# Patient Record
Sex: Female | Born: 1990
Health system: Southern US, Community
[De-identification: ages and names within clinical notes are randomized; demographics above are authoritative.]

## PROBLEM LIST (undated history)

## (undated) DIAGNOSIS — R011 Cardiac murmur, unspecified: Secondary | ICD-10-CM

## (undated) DIAGNOSIS — G43109 Migraine with aura, not intractable, without status migrainosus: Secondary | ICD-10-CM

## (undated) DIAGNOSIS — J309 Allergic rhinitis, unspecified: Secondary | ICD-10-CM

## (undated) DIAGNOSIS — N390 Urinary tract infection, site not specified: Secondary | ICD-10-CM

## (undated) DIAGNOSIS — R519 Headache, unspecified: Secondary | ICD-10-CM

## (undated) DIAGNOSIS — R9082 White matter disease, unspecified: Secondary | ICD-10-CM

## (undated) DIAGNOSIS — J45909 Unspecified asthma, uncomplicated: Secondary | ICD-10-CM

## (undated) DIAGNOSIS — E538 Deficiency of other specified B group vitamins: Secondary | ICD-10-CM

## (undated) DIAGNOSIS — R51 Headache: Secondary | ICD-10-CM

## (undated) DIAGNOSIS — G43909 Migraine, unspecified, not intractable, without status migrainosus: Secondary | ICD-10-CM

## (undated) DIAGNOSIS — F419 Anxiety disorder, unspecified: Secondary | ICD-10-CM

## (undated) HISTORY — DX: Migraine with aura, not intractable, without status migrainosus: G43.109

## (undated) HISTORY — DX: Urinary tract infection, site not specified: N39.0

## (undated) HISTORY — PX: NO PAST SURGERIES: SHX2092

## (undated) HISTORY — PX: OTHER SURGICAL HISTORY: SHX169

## (undated) HISTORY — DX: Headache: R51

## (undated) HISTORY — DX: Migraine, unspecified, not intractable, without status migrainosus: G43.909

## (undated) HISTORY — DX: Headache, unspecified: R51.9

## (undated) HISTORY — DX: Allergic rhinitis, unspecified: J30.9

## (undated) HISTORY — DX: White matter disease, unspecified: R90.82

## (undated) HISTORY — DX: Unspecified asthma, uncomplicated: J45.909

## (undated) HISTORY — DX: Cardiac murmur, unspecified: R01.1

## (undated) HISTORY — DX: Anxiety disorder, unspecified: F41.9

---
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
## 2015-09-27 ENCOUNTER — Ambulatory Visit (INDEPENDENT_AMBULATORY_CARE_PROVIDER_SITE_OTHER): Payer: BLUE CROSS/BLUE SHIELD | Admitting: Neurology

## 2015-09-27 ENCOUNTER — Other Ambulatory Visit: Payer: Self-pay | Admitting: Neurology

## 2015-09-27 ENCOUNTER — Telehealth: Payer: Self-pay | Admitting: Neurology

## 2015-09-27 ENCOUNTER — Encounter: Payer: Self-pay | Admitting: Neurology

## 2015-09-27 VITALS — BP 101/67 | HR 69 | Ht 59.0 in | Wt 107.2 lb

## 2015-09-27 DIAGNOSIS — R51 Headache: Secondary | ICD-10-CM

## 2015-09-27 DIAGNOSIS — R519 Headache, unspecified: Secondary | ICD-10-CM

## 2015-09-27 DIAGNOSIS — G43109 Migraine with aura, not intractable, without status migrainosus: Secondary | ICD-10-CM

## 2015-09-27 DIAGNOSIS — G35 Multiple sclerosis: Secondary | ICD-10-CM

## 2015-09-27 DIAGNOSIS — R42 Dizziness and giddiness: Secondary | ICD-10-CM | POA: Diagnosis not present

## 2015-09-27 DIAGNOSIS — H538 Other visual disturbances: Secondary | ICD-10-CM

## 2015-09-27 DIAGNOSIS — G379 Demyelinating disease of central nervous system, unspecified: Secondary | ICD-10-CM

## 2015-09-27 DIAGNOSIS — R9082 White matter disease, unspecified: Secondary | ICD-10-CM | POA: Insufficient documentation

## 2015-09-27 DIAGNOSIS — R930 Abnormal findings on diagnostic imaging of skull and head, not elsewhere classified: Secondary | ICD-10-CM

## 2015-09-27 DIAGNOSIS — G43809 Other migraine, not intractable, without status migrainosus: Secondary | ICD-10-CM

## 2015-09-27 MED ORDER — ALPRAZOLAM 0.5 MG PO TABS: ORAL_TABLET | ORAL | 0 refills | Status: DC

## 2015-09-27 NOTE — Telephone Encounter (Signed)
Pt states she needs valium to take prior to [facility]

## 2015-09-27 NOTE — Telephone Encounter (Signed)
Called and LVM for pt letting her know we will send rx xanax to her pharmacy. Advised her not to drive to and from test d/t it causing sedation and drowsiness. Gave [facility] phone number if she has further questions.   Fax: [phone]. Received confirmation.

## 2015-09-27 NOTE — Progress Notes (Addendum)
[facility]    Provider:  Dr Jaynee Eagles Referring Provider: Antoine Primas, MD Primary Care Physician:  Antoine Primas, MD  CC:  Vertigo and double vision  HPI:  Grace Calhoun is a 25 y.o. female here as a referral from Dr. Lacey Jensen for dizziness, ocular migraines. Past medical history of abnormal white matter changes on MRI, ocular migraines. She says she was diagnosed with possible MS or demyelinating disease and she has been under surveillance every 6 months. She has had 2 LPs. She was at work and she had an episode of left eye vision changes with diplopia and vertigo in 10/2013 which was the initial onset of symptoms without any inciting event or known trigger or head trauma . The double vision lasted less than a minute. She had residual light headedness. She had another episode a few weeks ago and she was sitting down and she felt like her head "slung forward" her eyesight "flung forward" but her head didn't move and then she experienced a severe sharp headache on both sides. Unknown triggers, no vertigo, just feeling spacey and fatigue. She is in grad school. She sees a neurologist in [place] ([facility] and she went to [facility]). Both episodes less than a minute. She has residual , dizziness, blurry vision. No other symptoms. She is under stress. She gets "hazy" a lot. She denies any current double vision,  numbness, tingling, weakness, no urination problems. No FHx of MS, aunt has an autoimmune disorder. No other associated symptoms or modifying factors. She continues to have dizziness and lightheadedness and blurry vision continuously. She has not had an ocular migraine for a while.   Reviewed notes, labs and imaging from outside physicians, which showed:   Reviewed notes her primary care. Patient recently moved to the area from [place]. In October 2015 she had an episode of vertigo and double vision. She had 2 lumbar  punctures performed. Working diagnosis was multiple sclerosis per patient results were inconclusive from lumbar punctures. She did have a scan and reported that she has a "lesion in my brain" that is being followed with serial MRIs every 6 months. Last MRI was April 2017. Patient stated this episode was different than her original one, she was sitting down and had a sensation that her head had been flung forward and then back again, vision suddenly went down than up again, sensation followed by a very brief head pain and then residual lightheadedness lasting 2-3 minutes. She has a history of ocular migraines with aura and hip pain. .   Review of Systems: Patient complains of symptoms per HPI as well as the following symptoms: No CP, no SOB, double vision, vertigo. Pertinent negatives per HPI. All others negative.   Social History   Social History  . Marital status: Married    Spouse name: Mikeal Hawthorne  . Number of children: 0  . Years of education: 68   Occupational History  . Grad student    Social History Main Topics  . Smoking status: Never Smoker  . Smokeless tobacco: Never Used  . Alcohol use Yes     Comment: Socially  . Drug use: No  . Sexual activity: Not on file   Other Topics Concern  . Not on file   Social History Narrative   Lives husband.   Caffeine use:  Drinks coffee daily    Family History  Problem Relation Age of Onset  . Heart disease Paternal Grandfather     Past Medical  History:  Diagnosis Date  . Ocular migraine   . White matter abnormality on MRI of brain     Past Surgical History:  Procedure Laterality Date  . NO PAST SURGERIES      Current Outpatient Prescriptions  Medication Sig Dispense Refill  . ibuprofen (ADVIL,MOTRIN) 200 MG tablet Take 200 mg by mouth every 6 (six) hours as needed.    Marland Kitchen VALERIAN PO Take 1 Dose by mouth 3 (three) times a week.     No current facility-administered medications for this visit.     Allergies as of  09/27/2015  . (Not on File)    Vitals: BP 101/67 (BP Location: Right Arm, Patient Position: Sitting, Cuff Size: Normal)   Pulse 69   Ht 4\' 11"  (1.499 m)   Wt 107 lb 3.2 oz (48.6 kg)   BMI 21.65 kg/m  Last Weight:  Wt Readings from Last 1 Encounters:  09/27/15 107 lb 3.2 oz (48.6 kg)   Last Height:   Ht Readings from Last 1 Encounters:  09/27/15 4\' 11"  (1.499 m)    Physical exam: Exam: Gen: NAD, conversant, well nourised, well groomed                     CV: RRR, no MRG. No Carotid Bruits. No peripheral edema, warm, nontender Eyes: Conjunctivae clear without exudates or hemorrhage  Neuro: Detailed Neurologic Exam  Speech:    Speech is normal; fluent and spontaneous with normal comprehension.  Cognition:    The patient is oriented to person, place, and time;     recent and remote memory intact;     language fluent;     normal attention, concentration,     fund of knowledge Cranial Nerves:    The pupils are equal, round, and reactive to light. The fundi are normal and spontaneous venous pulsations are present. Visual fields are full to finger confrontation. Extraocular movements are intact. Trigeminal sensation is intact and the muscles of mastication are normal. The face is symmetric. The palate elevates in the midline. Hearing intact. Voice is normal. Shoulder shrug is normal. The tongue has normal motion without fasciculations.   Coordination:    Normal finger to nose and heel to shin. Normal rapid alternating movements.   Gait:    Heel-toe and tandem gait are normal.   Motor Observation:    No asymmetry, no atrophy, and no involuntary movements noted. Tone:    Normal muscle tone.    Posture:    Posture is normal. normal erect    Strength:    Strength is V/V in the upper and lower limbs.      Sensation: intact to LT     Reflex Exam:  DTR's:    Deep tendon reflexes in the upper and lower extremities are normal bilaterally.   Toes:    The toes are  downgoing bilaterally.   Clonus:    Clonus is absent.      Assessment/Plan:  25 year old female with history of demyelinating plaques in the brain. Patient has been seeing a neurologist in [place] for surveillance every 6 months of the brain and cervical spine. Patient with new onset symptoms, residual dizziness, lightheadedness, cognitive changes of fogginess. She has had multiple lumbar punctures in the past. We'll request all the information.  - Will request records from previous neurologist and need CDs with imaging - MRI of the brain and cervical spine for evaluation of demyelinating plaques seen on MRI in the past with new onset  symptoms concerning for new demyelinating plaques or exacerbation - Labs today  Addendum 02/12/2016: MRI of the brain 10/2013 and 03/2015 showed a stable intra-axial mass within the right cerebellum measuring 1.6 x 2cm.  Cc: Antoine Primas, MD  Sarina Ill, MD  [facility] [address] [place], [place] [address]  Phone [phone] Fax [phone]

## 2015-09-27 NOTE — Patient Instructions (Addendum)
Remember to drink plenty of fluid, eat healthy meals and do not skip any meals. Try to eat protein with a every meal and eat a healthy snack such as fruit or nuts in between meals. Try to keep a regular sleep-wake schedule and try to exercise daily, particularly in the form of walking, 20-30 minutes a day, if you can.   As far as diagnostic testing: MRI of the brain, labs, will request records  Our phone number is [phone]. We also have an after hours call service for urgent matters and there is a physician on-call for urgent questions. For any emergencies you know to call 911 or go to the nearest emergency room

## 2015-09-27 NOTE — Telephone Encounter (Signed)
prinitng now thanks

## 2015-09-28 ENCOUNTER — Telehealth: Payer: Self-pay | Admitting: *Deleted

## 2015-09-28 LAB — CBC WITH DIFFERENTIAL/PLATELET
BASOS: 0 %
Basophils Absolute: 0 10*3/uL (ref 0.0–0.2)
EOS (ABSOLUTE): 0.1 10*3/uL (ref 0.0–0.4)
EOS: 1 %
HEMATOCRIT: 42.4 % (ref 34.0–46.6)
Hemoglobin: 14.4 g/dL (ref 11.1–15.9)
IMMATURE GRANS (ABS): 0 10*3/uL (ref 0.0–0.1)
IMMATURE GRANULOCYTES: 0 %
LYMPHS: 31 %
Lymphocytes Absolute: 2.2 10*3/uL (ref 0.7–3.1)
MCH: 30.3 pg (ref 26.6–33.0)
MCHC: 34 g/dL (ref 31.5–35.7)
MCV: 89 fL (ref 79–97)
Monocytes Absolute: 0.5 10*3/uL (ref 0.1–0.9)
Monocytes: 7 %
NEUTROS ABS: 4.2 10*3/uL (ref 1.4–7.0)
NEUTROS PCT: 61 %
Platelets: 302 10*3/uL (ref 150–379)
RBC: 4.75 x10E6/uL (ref 3.77–5.28)
RDW: 12.7 % (ref 12.3–15.4)
WBC: 7 10*3/uL (ref 3.4–10.8)

## 2015-09-28 LAB — COMPREHENSIVE METABOLIC PANEL
A/G RATIO: 1.8 (ref 1.2–2.2)
ALT: 8 IU/L (ref 0–32)
AST: 18 IU/L (ref 0–40)
Albumin: 4.6 g/dL (ref 3.5–5.5)
Alkaline Phosphatase: 88 IU/L (ref 39–117)
BUN/Creatinine Ratio: 12 (ref 9–23)
BUN: 8 mg/dL (ref 6–20)
Bilirubin Total: 0.6 mg/dL (ref 0.0–1.2)
CALCIUM: 9.6 mg/dL (ref 8.7–10.2)
CO2: 25 mmol/L (ref 18–29)
CREATININE: 0.65 mg/dL (ref 0.57–1.00)
Chloride: 101 mmol/L (ref 96–106)
GFR, EST AFRICAN AMERICAN: 145 mL/min/{1.73_m2} (ref >59)
GFR, EST NON AFRICAN AMERICAN: 126 mL/min/{1.73_m2} (ref >59)
GLOBULIN, TOTAL: 2.5 g/dL (ref 1.5–4.5)
Glucose: 68 mg/dL (ref 65–99)
POTASSIUM: 3.9 mmol/L (ref 3.5–5.2)
SODIUM: 140 mmol/L (ref 134–144)
TOTAL PROTEIN: 7.1 g/dL (ref 6.0–8.5)

## 2015-09-28 NOTE — Telephone Encounter (Signed)
-----   Message from Melvenia Beam, MD sent at 09/28/2015 10:42 AM EDT ----- Labs normal

## 2015-09-28 NOTE — Telephone Encounter (Signed)
I faxed a release to  [facility] requesting office notes, labs, and MRI CDs reports on 09/28/15. Fax [phone] # [phone] . I also faxed to [facility] requesting notes, lab test, mri results. Fax [phone] # [phone] on 09/2715.

## 2015-09-28 NOTE — Telephone Encounter (Signed)
Called and LVM for pt about normal labs per Dr Jaynee Eagles. Gave [facility] phone number if she has further questions.

## 2015-10-11 NOTE — Telephone Encounter (Signed)
Patient called requesting to change the time of her MRI appointment tomorrow. Reine Just, who will call patient back and requested patient's insurance info. Patient "doesn't know insurance info off the top of her head and is driving right now".  Patient is afraid Andee Poles won't get back w/her in time and she has back to back appointments today, once she gets to work and she doesn't want to get charged because she can't make that appointment time tomorrow, she needs the latest time, she wants arrival time of 4 or 4:30pm or push to next Wednesday. Patient advised per Andee Poles, latest appointment time tomorrow is 11:30. Per Andee Poles, patient will need to speak to Angie in billing about fee. Patient advised that her appointment time is 7:30am and she has called < 24 hrs from appointment time. Patient states she called at 7am and no one answered. I asked patient if she spoke w/answering service, patient states no one, including answering service picked up her call. Transferred patient to Angie/Billing and Insurance.

## 2015-10-12 ENCOUNTER — Other Ambulatory Visit: Payer: BLUE CROSS/BLUE SHIELD

## 2015-10-12 NOTE — Telephone Encounter (Signed)
Spoke with patient and cancelled apt.

## 2015-11-07 ENCOUNTER — Encounter: Payer: Self-pay | Admitting: Neurology

## 2016-01-04 ENCOUNTER — Ambulatory Visit: Payer: BLUE CROSS/BLUE SHIELD | Admitting: Neurology

## 2016-01-04 ENCOUNTER — Other Ambulatory Visit: Payer: Self-pay

## 2016-01-05 ENCOUNTER — Telehealth: Payer: Self-pay | Admitting: Neurology

## 2016-01-05 NOTE — Telephone Encounter (Signed)
Called and LVM for pt advising MRI brain and MRI cervical spine are two separate tests (looks at different areas of the body) and cannot be combined. Advised her to call back if she has any further questions or concerns.

## 2016-01-05 NOTE — Telephone Encounter (Signed)
Patient called in reference to MRI brain/C-spine, she is wanting to know if there is any way to do one MRI that is able to cover the brain and the C-spine? Please call

## 2016-01-05 NOTE — Telephone Encounter (Signed)
Called and spoke to patient. She stated she knows Dr Jaynee Eagles recommended MRI brain but is wondering if she recommends she have cervical also. At visit, she mentioned to Dr Jaynee Eagles that previous neurologist suggested doing cervical also. She can have both, but she is wondering if Dr Jaynee Eagles only ordered because she suggested this or if she should only have MRI brain. Advised I will send message to Dr Jaynee Eagles and call her back tomorrow to advise.

## 2016-01-05 NOTE — Telephone Encounter (Signed)
Grace Calhoun, both studies have already been ordered back in September and she is scheduled for two studies already. An MRI of the brain and MRI of the cervical spine were ordered at appointment, not sure why she didn't know that thanks

## 2016-01-05 NOTE — Telephone Encounter (Signed)
Patient calling back with a few more questions regarding MRI's. Please call

## 2016-01-06 NOTE — Telephone Encounter (Signed)
Called and spoke to pt. Advised per AA,MD, she would like her to have both tests to rule out possible lesions in both locations. Pt is agreeable to plan. She is having tests this upcoming Tuesday. Advised we will call her with results once those come back.

## 2016-01-10 ENCOUNTER — Ambulatory Visit
Admission: RE | Admit: 2016-01-10 | Discharge: 2016-01-10 | Disposition: A | Discharge status: Home / Self Care | Payer: BLUE CROSS/BLUE SHIELD | Source: Ambulatory Visit | Attending: Neurology | Admitting: Neurology

## 2016-01-10 DIAGNOSIS — R42 Dizziness and giddiness: Secondary | ICD-10-CM

## 2016-01-10 DIAGNOSIS — R51 Headache: Secondary | ICD-10-CM

## 2016-01-10 DIAGNOSIS — H538 Other visual disturbances: Secondary | ICD-10-CM

## 2016-01-10 DIAGNOSIS — R9082 White matter disease, unspecified: Secondary | ICD-10-CM

## 2016-01-10 DIAGNOSIS — M5020 Other cervical disc displacement, unspecified cervical region: Secondary | ICD-10-CM | POA: Diagnosis not present

## 2016-01-10 DIAGNOSIS — G35 Multiple sclerosis: Secondary | ICD-10-CM

## 2016-01-10 DIAGNOSIS — G379 Demyelinating disease of central nervous system, unspecified: Secondary | ICD-10-CM | POA: Diagnosis not present

## 2016-01-10 DIAGNOSIS — R519 Headache, unspecified: Secondary | ICD-10-CM

## 2016-01-10 DIAGNOSIS — G939 Disorder of brain, unspecified: Secondary | ICD-10-CM | POA: Diagnosis not present

## 2016-01-10 MED ORDER — GADOBENATE DIMEGLUMINE 529 MG/ML IV SOLN
9.0000 mL | Freq: Once | INTRAVENOUS | Status: AC | PRN
  Administered 2016-01-10: 9 mL via INTRAVENOUS

## 2016-01-17 ENCOUNTER — Telehealth: Payer: Self-pay | Admitting: *Deleted

## 2016-01-17 NOTE — Telephone Encounter (Signed)
LVM for pt to call about results. Gave [facility] phone number.  

## 2016-01-17 NOTE — Telephone Encounter (Signed)
-----   Message from Melvenia Beam, MD sent at 01/16/2016  5:54 PM EST ----- The MRI of her brain does have focus/lesion in the cerebellum. I believe this is the lesion that they are watching her for MS. However it does not appear that there are any new lesions. I still have not received the MRI images from her previous neurologist and I will look into this because I would like to compare this MRI with her old MRI.  MRI cervical spine also did not show any lesions. I believe her imaging appears stable.  thanks.

## 2016-01-17 NOTE — Telephone Encounter (Signed)
Called and spoke to pt about results per AA,MD note. She verbalized understanding. She is also going to call to see if she can get her records sent to get previous MRI CD. Advised I will have Grace Calhoun in medical records request this again.  Patient states she might have a copy of MRI CD. If so, she will drop it off for Dr Jaynee Eagles to review.

## 2016-01-20 ENCOUNTER — Telehealth: Payer: Self-pay | Admitting: *Deleted

## 2016-01-20 NOTE — Telephone Encounter (Addendum)
Pt medical records from [facility] on [place] desk.

## 2016-01-24 DIAGNOSIS — N390 Urinary tract infection, site not specified: Secondary | ICD-10-CM | POA: Diagnosis not present

## 2016-01-24 DIAGNOSIS — R350 Frequency of micturition: Secondary | ICD-10-CM | POA: Diagnosis not present

## 2016-01-30 DIAGNOSIS — R350 Frequency of micturition: Secondary | ICD-10-CM | POA: Diagnosis not present

## 2016-02-13 DIAGNOSIS — N3000 Acute cystitis without hematuria: Secondary | ICD-10-CM | POA: Diagnosis not present

## 2016-02-13 DIAGNOSIS — R350 Frequency of micturition: Secondary | ICD-10-CM | POA: Diagnosis not present

## 2016-02-13 NOTE — Telephone Encounter (Signed)
Patient called office in reference from [facility] to see if we received MRI results to compare MRI she has had done with [place].  Please call

## 2016-02-15 NOTE — Telephone Encounter (Signed)
I did and it is stable thanks

## 2016-02-22 DIAGNOSIS — F418 Other specified anxiety disorders: Secondary | ICD-10-CM | POA: Diagnosis not present

## 2016-03-07 DIAGNOSIS — F418 Other specified anxiety disorders: Secondary | ICD-10-CM | POA: Diagnosis not present

## 2016-03-09 DIAGNOSIS — Z3009 Encounter for other general counseling and advice on contraception: Secondary | ICD-10-CM | POA: Diagnosis not present

## 2016-03-09 DIAGNOSIS — Z01419 Encounter for gynecological examination (general) (routine) without abnormal findings: Secondary | ICD-10-CM | POA: Diagnosis not present

## 2016-03-09 DIAGNOSIS — Z6821 Body mass index (BMI) 21.0-21.9, adult: Secondary | ICD-10-CM | POA: Diagnosis not present

## 2016-05-10 ENCOUNTER — Telehealth: Payer: Self-pay | Admitting: Neurology

## 2016-05-10 NOTE — Telephone Encounter (Signed)
Multiple previous MRIs have been stable. I think we can hold off for one year unless she has new symptoms then she is to call us immediately thanks

## 2016-05-10 NOTE — Telephone Encounter (Signed)
Called pt to schedule 1 yr follow-up. Asks if she should have another MRI this summer. Stated that her previous neurologist recommended imaging every 6 months.

## 2016-05-10 NOTE — Telephone Encounter (Signed)
Pt was last seen in Sept 2017. MRI of her brain (01/2016) showed stable focus/lesion in the cerebellum.

## 2016-05-10 NOTE — Telephone Encounter (Signed)
Returned pt call and let her know that MRIs can now be done yearly but to call immediately w/ new or worsening symptoms. In the meantime, requested that she call to schedule 1 yr follow-up appt in Sept. May also call w/ any questions/concerns.

## 2016-05-10 NOTE — Telephone Encounter (Signed)
Patient called office in reference to see when she is to return for a follow up visit last seen 9/17.  Please call

## 2016-05-10 NOTE — Telephone Encounter (Signed)
One year follow up thanks

## 2016-05-31 DIAGNOSIS — Z30430 Encounter for insertion of intrauterine contraceptive device: Secondary | ICD-10-CM | POA: Diagnosis not present

## 2016-05-31 DIAGNOSIS — Z3202 Encounter for pregnancy test, result negative: Secondary | ICD-10-CM | POA: Diagnosis not present

## 2016-07-24 DIAGNOSIS — Z30431 Encounter for routine checking of intrauterine contraceptive device: Secondary | ICD-10-CM | POA: Diagnosis not present

## 2016-08-14 DIAGNOSIS — Z3202 Encounter for pregnancy test, result negative: Secondary | ICD-10-CM | POA: Diagnosis not present

## 2016-08-14 DIAGNOSIS — N926 Irregular menstruation, unspecified: Secondary | ICD-10-CM | POA: Diagnosis not present

## 2016-08-14 DIAGNOSIS — R102 Pelvic and perineal pain: Secondary | ICD-10-CM | POA: Diagnosis not present

## 2016-08-14 DIAGNOSIS — N911 Secondary amenorrhea: Secondary | ICD-10-CM | POA: Diagnosis not present

## 2016-10-01 DIAGNOSIS — R102 Pelvic and perineal pain: Secondary | ICD-10-CM | POA: Diagnosis not present

## 2016-10-18 ENCOUNTER — Ambulatory Visit (INDEPENDENT_AMBULATORY_CARE_PROVIDER_SITE_OTHER): Payer: BLUE CROSS/BLUE SHIELD | Admitting: Neurology

## 2016-10-18 ENCOUNTER — Encounter: Payer: Self-pay | Admitting: Neurology

## 2016-10-18 VITALS — BP 110/68 | HR 65 | Wt 111.2 lb

## 2016-10-18 DIAGNOSIS — R519 Headache, unspecified: Secondary | ICD-10-CM

## 2016-10-18 DIAGNOSIS — R42 Dizziness and giddiness: Secondary | ICD-10-CM | POA: Diagnosis not present

## 2016-10-18 DIAGNOSIS — C719 Malignant neoplasm of brain, unspecified: Secondary | ICD-10-CM | POA: Diagnosis not present

## 2016-10-18 DIAGNOSIS — C716 Malignant neoplasm of cerebellum: Secondary | ICD-10-CM | POA: Diagnosis not present

## 2016-10-18 DIAGNOSIS — R51 Headache with orthostatic component, not elsewhere classified: Secondary | ICD-10-CM

## 2016-10-18 NOTE — Patient Instructions (Signed)
Remember to drink plenty of fluid, eat healthy meals and do not skip any meals. Try to eat protein with a every meal and eat a healthy snack such as fruit or nuts in between meals. Try to keep a regular sleep-wake schedule and try to exercise daily, particularly in the form of walking, 20-30 minutes a day, if you can.   I would like to see you back in 9 months, sooner if we need to. Please call us with any interim questions, concerns, problems, updates or refill requests.   Our phone number is [phone]. We also have an after hours call service for urgent matters and there is a physician on-call for urgent questions. For any emergencies you know to call 911 or go to the nearest emergency room

## 2016-10-18 NOTE — Progress Notes (Signed)
[facility]    Provider:  Dr Jaynee Eagles  CC:  Low grade Glioma  Interval update: 26 year old female with history of an intra-axial 2*2 cm mass right cerebellum possibly low-grade glioma. Patient with vertigo and positional headaches. Need follow up MRI brain as well as referral to [facility] neurology for possible biopsy or surgical treatment if clinically warranted.     HPI:  Grace Calhoun is a 26 y.o. female here as a referral from Dr. Lacey Jensen for dizziness, ocular migraines. Past medical history of abnormal white matter changes on MRI, ocular migraines. She says she was diagnosed with possible MS or demyelinating disease and she has been under surveillance every 6 months. She has had 2 LPs. She was at work and she had an episode of left eye vision changes with diplopia and vertigo in 10/2013 which was the initial onset of symptoms without any inciting event or known trigger or head trauma . The double vision lasted less than a minute. She had residual light headedness. She had another episode a few weeks ago and she was sitting down and she felt like her head "slung forward" her eyesight "flung forward" but her head didn't move and then she experienced a severe sharp headache on both sides. Unknown triggers, no vertigo, just feeling spacey and fatigue. She is in grad school. She sees a neurologist in [place] ([facility] and she went to [facility]). Both episodes less than a minute. She has residual , dizziness, blurry vision. No other symptoms. She is under stress. She gets "hazy" a lot. She denies any current double vision,  numbness, tingling, weakness, no urination problems. No FHx of MS, aunt has an autoimmune disorder. No other associated symptoms or modifying factors. She continues to have dizziness and lightheadedness and blurry vision continuously. She has not had an ocular migraine for a while.   Reviewed notes, labs and imaging  from outside physicians, which showed:   Reviewed notes her primary care. Patient recently moved to the area from [place]. In October 2015 she had an episode of vertigo and double vision. She had 2 lumbar punctures performed. Working diagnosis was multiple sclerosis per patient results were inconclusive from lumbar punctures. She did have a scan and reported that she has a "lesion in my brain" that is being followed with serial MRIs every 6 months. Last MRI was April 2017. Patient stated this episode was different than her original one, she was sitting down and had a sensation that her head had been flung forward and then back again, vision suddenly went down than up again, sensation followed by a very brief head pain and then residual lightheadedness lasting 2-3 minutes. She has a history of ocular migraines with aura and hip pain. .   Review of Systems: Patient complains of symptoms per HPI as well as the following symptoms: No CP, no SOB, double vision, vertigo. Pertinent negatives per HPI. All others negative.  Social History   Social History  . Marital status: Married    Spouse name: Mikeal Hawthorne  . Number of children: 0  . Years of education: 10   Occupational History  . Grad student    Social History Main Topics  . Smoking status: Never Smoker  . Smokeless tobacco: Never Used  . Alcohol use Yes     Comment: Socially  . Drug use: No  . Sexual activity: Yes   Other Topics Concern  . Not on file   Social History Narrative   Lives  at home with husband.   Caffeine use:  Drinks coffee daily   Right handed       Family History  Problem Relation Age of Onset  . Heart disease Paternal Grandfather     Past Medical History:  Diagnosis Date  . Ocular migraine   . White matter abnormality on MRI of brain     Past Surgical History:  Procedure Laterality Date  . NO PAST SURGERIES      Current Outpatient Prescriptions  Medication Sig Dispense Refill  . ALPRAZolam (XANAX)  0.5 MG tablet Take 1-2 30-60 minutes before MRI. May take one additional if needed. Do not drive 3 tablet 0  . ibuprofen (ADVIL,MOTRIN) 200 MG tablet Take 200 mg by mouth every 6 (six) hours as needed.    Marland Kitchen VALERIAN PO Take 1 Dose by mouth 3 (three) times a week. Takes as needed     No current facility-administered medications for this visit.     Allergies as of 10/18/2016 - Review Complete 09/27/2015  Allergen Reaction Noted  . Naproxen  02/13/2016    Vitals: BP 110/68   Pulse 65   Wt 111 lb 3.2 oz (50.4 kg)   BMI 22.46 kg/m  Last Weight:  Wt Readings from Last 1 Encounters:  10/18/16 111 lb 3.2 oz (50.4 kg)   Last Height:   Ht Readings from Last 1 Encounters:  09/27/15 4\' 11"  (1.499 m)    Physical exam: Exam: Gen: NAD, conversant, well nourised, well groomed                     CV: RRR, no MRG. No Carotid Bruits. No peripheral edema, warm, nontender Eyes: Conjunctivae clear without exudates or hemorrhage  Neuro: Detailed Neurologic Exam  Speech:    Speech is normal; fluent and spontaneous with normal comprehension.  Cognition:    The patient is oriented to person, place, and time;     recent and remote memory intact;     language fluent;     normal attention, concentration,     fund of knowledge Cranial Nerves:    The pupils are equal, round, and reactive to light. The fundi are normal and spontaneous venous pulsations are present. Visual fields are full to finger confrontation. Extraocular movements are intact. Trigeminal sensation is intact and the muscles of mastication are normal. The face is symmetric. The palate elevates in the midline. Hearing intact. Voice is normal. Shoulder shrug is normal. The tongue has normal motion without fasciculations.   Coordination:    Normal finger to nose and heel to shin. Normal rapid alternating movements.   Gait:    Heel-toe and tandem gait are normal.   Motor Observation:    No asymmetry, no atrophy, and no involuntary  movements noted. Tone:    Normal muscle tone.    Posture:    Posture is normal. normal erect    Strength:    Strength is V/V in the upper and lower limbs.      Sensation: intact to LT     Reflex Exam:  DTR's:    Deep tendon reflexes in the upper and lower extremities are normal bilaterally.   Toes:    The toes are equiv bilaterally.   Clonus:    Clonus is absent.      Assessment/Plan:  26 year old female with history of an intra-axial 2*2 cm mass right cerebellum possibly low-grade glioma. Patient with vertigo and positional headaches.  -MRI of the brain to follow low-grade  glioma due to continued vertigo, nausea, positional headache - Need follow up MRI brain as well as referral to [facility] neurology(Dr. Tommi Rumps) for possible biopsy or surgical treatment if clinically warranted.   Addendum 02/12/2016: MRI of the brain 10/2013 and 03/2015 showed a stable intra-axial mass within the right cerebellum measuring 1.6 x 2cm. LPs not consistent with multiple sclerosis.    Cc: Antoine Primas, MD  Orders Placed This Encounter  Procedures  . MR BRAIN W WO CONTRAST  . Ambulatory referral to Neurosurgery    Sarina Ill, MD  [facility] [address] [place], [place] [address]  Phone [phone] Fax [phone]  A total of 30 minutes was spent face-to-face with this patient. Over half this time was spent on counseling patient on the glioma diagnosis and different diagnostic and therapeutic options available.

## 2016-10-20 ENCOUNTER — Encounter: Payer: Self-pay | Admitting: Neurology

## 2016-10-21 ENCOUNTER — Telehealth: Payer: Self-pay | Admitting: Neurology

## 2016-10-21 DIAGNOSIS — C716 Malignant neoplasm of cerebellum: Secondary | ICD-10-CM | POA: Insufficient documentation

## 2016-10-21 NOTE — Telephone Encounter (Signed)
Grace Calhoun, can we get patient an appointment with Dr. Tommi Rumps only? We can wait for it, the glioma is stable.It is in a perilous location, would prefer [place]. Let patient know Grace Calhoun has to bring a CD with her latest imaging on it please thanks

## 2016-10-22 ENCOUNTER — Telehealth: Payer: Self-pay | Admitting: Radiology

## 2016-10-22 NOTE — Telephone Encounter (Signed)
Pt returned Emily's call °

## 2016-10-23 NOTE — Telephone Encounter (Signed)
Order has been sent to Dr. Shanda Bumps office Telephone [phone] - fax [phone].  I have spoke to patient she aware that she has to take her MRI disc. Dr, Jaynee Eagles is aware. Patient is waiting until January for her apt.

## 2016-10-23 NOTE — Telephone Encounter (Signed)
Left a voicemail for patient to call back about scheduling mri.

## 2016-10-23 NOTE — Telephone Encounter (Signed)
She can wait until the first of the year can we schedule it out that far?

## 2016-10-23 NOTE — Telephone Encounter (Signed)
I called the patient to schedule her MRI.. I informed her that it would be about $718.31. She was wondering does she need to have it or now or can she wait until the first of the year? Please advise.

## 2016-10-23 NOTE — Telephone Encounter (Signed)
Noted, thank you I will call the patient and schedule her for the new year.

## 2016-10-23 NOTE — Telephone Encounter (Signed)
Spoke to the patient and informed her Per Dr. Jaynee Eagles it is okay to wait until the new year. She stated she will call me when it closer to the new year to schedule it. I stated that will be okay.

## 2016-11-13 ENCOUNTER — Telehealth: Payer: Self-pay | Admitting: Neurology

## 2016-11-13 ENCOUNTER — Encounter: Payer: Self-pay | Admitting: Internal Medicine

## 2016-11-13 ENCOUNTER — Ambulatory Visit: Payer: BLUE CROSS/BLUE SHIELD | Admitting: Internal Medicine

## 2016-11-13 VITALS — BP 112/72 | HR 70 | Temp 98.4°F | Ht 59.0 in | Wt 108.0 lb

## 2016-11-13 DIAGNOSIS — Z23 Encounter for immunization: Secondary | ICD-10-CM

## 2016-11-13 DIAGNOSIS — Z0000 Encounter for general adult medical examination without abnormal findings: Secondary | ICD-10-CM | POA: Diagnosis not present

## 2016-11-13 DIAGNOSIS — J309 Allergic rhinitis, unspecified: Secondary | ICD-10-CM | POA: Insufficient documentation

## 2016-11-13 DIAGNOSIS — F419 Anxiety disorder, unspecified: Secondary | ICD-10-CM | POA: Diagnosis not present

## 2016-11-13 HISTORY — DX: Allergic rhinitis, unspecified: J30.9

## 2016-11-13 HISTORY — DX: Anxiety disorder, unspecified: F41.9

## 2016-11-13 MED ORDER — DIAZEPAM 5 MG PO TABS: 5.0000 mg | ORAL_TABLET | Freq: Two times a day (BID) | ORAL | 0 refills | Status: DC | PRN

## 2016-11-13 NOTE — Patient Instructions (Addendum)
You had the flu shot today  Please take all new medication as prescribed - the valium as needed  Please continue all other medications as before, and refills have been done if requested.  Please have the pharmacy call with any other refills you may need.  Please continue your efforts at being more active, low cholesterol diet, and weight control.  You are otherwise up to date with prevention measures today.  Please keep your appointments with your specialists as you may have planned  Please go to the LAB in the Basement (turn left off the elevator) for the tests to be done today  You will be contacted by phone if any changes need to be made immediately.  Otherwise, you will receive a letter about your results with an explanation, but please check with MyChart first.  Please remember to sign up for MyChart if you have not done so, as this will be important to you in the future with finding out test results, communicating by private email, and scheduling acute appointments online when needed.  Please return in 1 year for your yearly visit, or sooner if needed

## 2016-11-13 NOTE — Assessment & Plan Note (Signed)

## 2016-11-13 NOTE — Assessment & Plan Note (Signed)
Mild situational, ok for small limited valium asd,  to f/u any worsening symptoms or concerns

## 2016-11-13 NOTE — Progress Notes (Signed)
Subjective:    Patient ID: Grace Calhoun, female    DOB: [birth year], 25 y.o.   MRN: [id number]  HPI  Here for wellness and f/u;  Overall doing ok;  Pt denies Chest pain, worsening SOB, DOE, wheezing, orthopnea, PND, worsening LE edema, palpitations, dizziness or syncope.  Pt denies neurological change such as new headache, facial or extremity weakness.  Pt denies polydipsia, polyuria, or low sugar symptoms. Pt states overall good compliance with treatment and medications, good tolerability, and has been trying to follow appropriate diet.  Pt denies worsening depressive symptoms, suicidal ideation or panic. No fever, night sweats, wt loss, loss of appetite, or other constitutional symptoms.  Pt states good ability with ADL's, has low fall risk, home safety reviewed and adequate, no other significant changes in hearing or vision, and very active with exercise, primarily running Working on masters for mental health counseling.  Has persistent anxiety d/o, no panic and Denies worsening depressive symptoms, suicidal ideation, or panic; is flying soon and is requesting tx Past Medical History:  Diagnosis Date  . Allergic rhinitis 11/13/2016  . Anxiety 11/13/2016  . Asthma   . Frequent headaches   . Heart murmur   . Migraines   . Ocular migraine   . UTI (urinary tract infection)   . White matter abnormality on MRI of brain    Past Surgical History:  Procedure Laterality Date  . lumbar puncture    . NO PAST SURGERIES      reports that  has never smoked. she has never used smokeless tobacco. She reports that she drinks alcohol. She reports that she does not use drugs. family history includes Heart disease in her paternal grandfather. Allergies  Allergen Reactions  . Naproxen     Other reaction(s): Dizziness   Current Outpatient Medications on File Prior to Visit  Medication Sig Dispense Refill  . ibuprofen (ADVIL,MOTRIN) 200 MG tablet Take 200 mg by mouth every 6 (six) hours as needed.    Marland Kitchen  VALERIAN PO Take 1 Dose by mouth 3 (three) times a week. Takes as needed     No current facility-administered medications on file prior to visit.    Review of Systems Constitutional: Negative for other unusual diaphoresis, sweats, appetite or weight changes HENT: Negative for other worsening hearing loss, ear pain, facial swelling, mouth sores or neck stiffness.   Eyes: Negative for other worsening pain, redness or other visual disturbance.  Respiratory: Negative for other stridor or swelling Cardiovascular: Negative for other palpitations or other chest pain  Gastrointestinal: Negative for worsening diarrhea or loose stools, blood in stool, distention or other pain Genitourinary: Negative for hematuria, flank pain or other change in urine volume.  Musculoskeletal: Negative for myalgias or other joint swelling.  Skin: Negative for other color change, or other wound or worsening drainage.  Neurological: Negative for other syncope or numbness. Hematological: Negative for other adenopathy or swelling Psychiatric/Behavioral: Negative for hallucinations, other worsening agitation, SI, self-injury, or new decreased concentration All other system neg per pt    Objective:   Physical Exam BP 112/72   Pulse 70   Temp 98.4 F (36.9 C) (Oral)   Ht 4\' 11"  (1.499 m)   Wt 108 lb (49 kg)   SpO2 99%   BMI 21.81 kg/m  VS noted,  Constitutional: Pt is oriented to person, place, and time. Appears well-developed and well-nourished, in no significant distress and comfortable Head: Normocephalic and atraumatic  Eyes: Conjunctivae and EOM are normal. Pupils are equal,  round, and reactive to light Right Ear: External ear normal without discharge Left Ear: External ear normal without discharge Nose: Nose without discharge or deformity Mouth/Throat: Oropharynx is without other ulcerations and moist  Neck: Normal range of motion. Neck supple. No JVD present. No tracheal deviation present or significant  neck LA or mass Cardiovascular: Normal rate, regular rhythm, normal heart sounds and intact distal pulses.   Pulmonary/Chest: WOB normal and breath sounds without rales or wheezing  Abdominal: Soft. Bowel sounds are normal. NT. No HSM  Musculoskeletal: Normal range of motion. Exhibits no edema Lymphadenopathy: Has no other cervical adenopathy.  Neurological: Pt is alert and oriented to person, place, and time. Pt has normal reflexes. No cranial nerve deficit. Motor grossly intact, Gait intact Skin: Skin is warm and dry. No rash noted or new ulcerations Psychiatric:  Has nervous mood and affect. Behavior is normal without agitation No other exam findings     Assessment & Plan:

## 2016-11-13 NOTE — Telephone Encounter (Signed)
Patient is scheduled to have her MRI done on 01/09/17 at the [facility] mobile unit. She did inform me that she is claustrophobic and will need something to help her.

## 2016-11-14 NOTE — Telephone Encounter (Signed)
Grace Calhoun, please place order for Xanax 0.25mg  take 1-2 tabs 30-60 minutes before MRI, may take one additional if needed, do not drive dispense 4, refill 0

## 2016-11-15 MED ORDER — ALPRAZOLAM 0.25 MG PO TABS: ORAL_TABLET | ORAL | 0 refills | Status: DC

## 2016-11-15 NOTE — Telephone Encounter (Signed)
Order written & printed for Xanax 0.25 mg tablets. Take 1-2 tabs 30-60 minutes before MRI. May take one additional if needed. Do not drive. Dispensed 4 tabs with 0 refills.

## 2016-12-19 NOTE — Telephone Encounter (Addendum)
Called and LVM asking patient if she still needs meds to help her relax for the MRI. Asked for call back to let us know and we can fax it to her pharmacy.

## 2016-12-28 NOTE — Telephone Encounter (Signed)
Called patient and LVM (ok per DPR) letting her know that Xanax prescription is being faxed to her pharmacy. Also included instructions: Take 1-2 tablets by mouth 30-60 minutes before MRI. May take one additional if needed. Do not drive. Asked for a call back to let us know she got this message. Xanax faxed to [facility] on [address]. Received a receipt of confirmation.

## 2017-01-09 ENCOUNTER — Ambulatory Visit: Payer: BLUE CROSS/BLUE SHIELD

## 2017-01-09 DIAGNOSIS — C719 Malignant neoplasm of brain, unspecified: Secondary | ICD-10-CM

## 2017-01-09 DIAGNOSIS — R42 Dizziness and giddiness: Secondary | ICD-10-CM

## 2017-01-09 DIAGNOSIS — R51 Headache with orthostatic component, not elsewhere classified: Secondary | ICD-10-CM

## 2017-01-09 DIAGNOSIS — R519 Headache, unspecified: Secondary | ICD-10-CM

## 2017-01-09 MED ORDER — GADOPENTETATE DIMEGLUMINE 469.01 MG/ML IV SOLN: 10.0000 mL | Freq: Once | INTRAVENOUS | Status: AC | PRN

## 2017-01-15 ENCOUNTER — Telehealth: Payer: Self-pay | Admitting: *Deleted

## 2017-01-15 NOTE — Telephone Encounter (Addendum)
Called patient and LVM informing her that her MRI brain is stable, no changes. Informed her that this message does not require a call back but encouraged her to call with any questions. Left office number if needed.   ----- Message from Melvenia Beam, MD sent at 01/11/2017  3:46 PM EST ----- MRI brain is stable. No changes. thanks

## 2017-03-13 ENCOUNTER — Encounter: Payer: Self-pay | Admitting: Family Medicine

## 2017-03-13 ENCOUNTER — Ambulatory Visit (INDEPENDENT_AMBULATORY_CARE_PROVIDER_SITE_OTHER): Payer: BLUE CROSS/BLUE SHIELD

## 2017-03-13 ENCOUNTER — Ambulatory Visit: Payer: BLUE CROSS/BLUE SHIELD | Admitting: Family Medicine

## 2017-03-13 VITALS — BP 116/64 | HR 84 | Temp 98.6°F | Ht 59.0 in | Wt 109.0 lb

## 2017-03-13 DIAGNOSIS — M79674 Pain in right toe(s): Secondary | ICD-10-CM

## 2017-03-13 MED ORDER — DICLOFENAC SODIUM 2 % TD SOLN: 1.0000 "application " | Freq: Two times a day (BID) | TRANSDERMAL | 3 refills | Status: DC

## 2017-03-13 NOTE — Assessment & Plan Note (Signed)
Pain is likely related to a sprain of the capsule of the first MTP joint. Does not appear to be any fracture. - Continue the postop shoe for at least 2 more weeks. Can try transitioning her as she allows. - Counseled on another over-the-counter devices - If no improvement follow-up we'll consider imaging and maybe pursue an MRI. Could consider an injection to help with pain.

## 2017-03-13 NOTE — Patient Instructions (Addendum)
Please try the brace and see how that does.  I have sent in pennsaid which you can rub over the area.  Ice may help with any pain.   Please follow up with me in 3-4 weeks if this hasn't improved.

## 2017-03-13 NOTE — Progress Notes (Signed)
Grace Calhoun - 27 y.o. female MRN [id number]  Date of birth: [birth year]  SUBJECTIVE:  Including CC & ROS.  Chief Complaint  Patient presents with  . Sprained toe    Grace Calhoun is a 27 y.o. female that is presenting with right toe pain. She was rock climbing six weeks ago and pushed off the wall. She noticed a sharp pain located at her MTP joint. She is unable to run or rock climb due to the pain. She saw a sports physican at [facility], stated she had turf toe. Pain is worse standing and being active. She has been wearing a post op shoe for the past four weeks. She has been taking Motrin for the pain. Denies swelling or tenderness. She had improvement with her symptoms but were exacerbated with dropping something on her foot about a week ago. She also has exacerbation of her pain with having to walk across campus.   Review of Systems  Constitutional: Negative for fever.  HENT: Negative for congestion.   Eyes: Negative for visual disturbance.  Respiratory: Negative for cough.   Cardiovascular: Negative for chest pain.  Gastrointestinal: Negative for abdominal pain.  Musculoskeletal: Positive for gait problem.  Skin: Negative for color change.  Neurological: Negative for weakness.  Hematological: Negative for adenopathy.  Psychiatric/Behavioral: Negative for agitation.    HISTORY: Past Medical, Surgical, Social, and Family History Reviewed & Updated per EMR.   Pertinent Historical Findings include:  Past Medical History:  Diagnosis Date  . Allergic rhinitis 11/13/2016  . Anxiety 11/13/2016  . Asthma   . Frequent headaches   . Heart murmur   . Migraines   . Ocular migraine   . UTI (urinary tract infection)   . White matter abnormality on MRI of brain     Past Surgical History:  Procedure Laterality Date  . lumbar puncture    . NO PAST SURGERIES      Allergies  Allergen Reactions  . Naproxen     Other reaction(s): Dizziness    Family History  Problem Relation Age of  Onset  . Heart disease Paternal Grandfather      Social History   Socioeconomic History  . Marital status: Married    Spouse name: Mikeal Hawthorne  . Number of children: 0  . Years of education: 40  . Highest education level: Not on file  Social Needs  . Financial resource strain: Not on file  . Food insecurity - worry: Not on file  . Food insecurity - inability: Not on file  . Transportation needs - medical: Not on file  . Transportation needs - non-medical: Not on file  Occupational History  . Occupation: Insurance underwriter  Tobacco Use  . Smoking status: Never Smoker  . Smokeless tobacco: Never Used  Substance and Sexual Activity  . Alcohol use: Yes    Comment: Socially  . Drug use: No  . Sexual activity: Yes  Other Topics Concern  . Not on file  Social History Narrative   Lives at home with husband.   Caffeine use:  Drinks coffee daily   Right handed     PHYSICAL EXAM:  VS: BP 116/64 (BP Location: Left Arm, Patient Position: Sitting, Cuff Size: Normal)   Pulse 84   Temp 98.6 F (37 C) (Oral)   Ht 4\' 11"  (1.499 m)   Wt 109 lb (49.4 kg)   SpO2 98%   BMI 22.02 kg/m  Physical Exam Gen: NAD, alert, cooperative with exam, well-appearing ENT: normal lips, normal nasal  mucosa,  Eye: normal EOM, normal conjunctiva and lids CV:  no edema, +2 pedal pulses   Resp: no accessory muscle use, non-labored,  Skin: no rashes, no areas of induration  Neuro: normal tone, normal sensation to touch Psych:  normal insight, alert and oriented MSK:  Right foot:  Hallux valgus b/l  No redness or swelling  Pain with flexion and extension of the first MTP  Normal longitudinal arch  Neurovascularly intact.   Limited ultrasound: right great toe:  Minimal effusion within the joint  Excessive motion of the capsule of the first MCP joint. No obvious fracture  Summary: Capsular sprain  Ultrasound and interpretation by Clearance Coots, MD      ASSESSMENT & PLAN:   No problem-specific  Assessment & Plan notes found for this encounter.

## 2017-05-08 DIAGNOSIS — Z01419 Encounter for gynecological examination (general) (routine) without abnormal findings: Secondary | ICD-10-CM | POA: Diagnosis not present

## 2017-05-08 DIAGNOSIS — Z6821 Body mass index (BMI) 21.0-21.9, adult: Secondary | ICD-10-CM | POA: Diagnosis not present

## 2017-05-31 ENCOUNTER — Ambulatory Visit: Payer: BLUE CROSS/BLUE SHIELD | Admitting: Family Medicine

## 2017-05-31 DIAGNOSIS — F418 Other specified anxiety disorders: Secondary | ICD-10-CM | POA: Diagnosis not present

## 2017-06-11 ENCOUNTER — Ambulatory Visit (INDEPENDENT_AMBULATORY_CARE_PROVIDER_SITE_OTHER): Payer: BLUE CROSS/BLUE SHIELD

## 2017-06-11 ENCOUNTER — Encounter: Payer: Self-pay | Admitting: Family Medicine

## 2017-06-11 ENCOUNTER — Ambulatory Visit: Payer: BLUE CROSS/BLUE SHIELD | Admitting: Family Medicine

## 2017-06-11 VITALS — BP 116/68 | HR 76 | Ht 59.0 in | Wt 108.0 lb

## 2017-06-11 DIAGNOSIS — M79674 Pain in right toe(s): Secondary | ICD-10-CM

## 2017-06-11 DIAGNOSIS — M2011 Hallux valgus (acquired), right foot: Secondary | ICD-10-CM | POA: Diagnosis not present

## 2017-06-11 MED ORDER — DICLOFENAC SODIUM 2 % TD SOLN: 1.0000 "application " | Freq: Two times a day (BID) | TRANSDERMAL | 3 refills | Status: AC

## 2017-06-11 NOTE — Progress Notes (Signed)
Grace Calhoun - 27 y.o. female MRN [id number]  Date of birth: 30-Sep-1990  SUBJECTIVE:  Including CC & ROS.  Chief Complaint  Patient presents with  . Follow-up    Grace Calhoun is a 27 y.o. female that is here today for right great toe pain follow up. She was seen 03/13/17 she reports no improvement in her pain. She has been trying to increase her activity-rock climbing and running. Pain is exacerbated with activity. Pain is localized at her medial 1st MTP joint. She has not been taking anything for the pain. She noticed some swelling after she walks or stands for long periods. No pain with walking. Pain is    Review of Systems  Constitutional: Negative for fever.  HENT: Negative for congestion.   Respiratory: Negative for cough.   Cardiovascular: Negative for chest pain.  Gastrointestinal: Negative for abdominal pain.  Musculoskeletal: Negative for gait problem.  Skin: Negative for color change.  Neurological: Negative for weakness.  Hematological: Negative for adenopathy.  Psychiatric/Behavioral: Negative for agitation.    HISTORY: Past Medical, Surgical, Social, and Family History Reviewed & Updated per EMR.   Pertinent Historical Findings include:  Past Medical History:  Diagnosis Date  . Allergic rhinitis 11/13/2016  . Anxiety 11/13/2016  . Asthma   . Frequent headaches   . Heart murmur   . Migraines   . Ocular migraine   . UTI (urinary tract infection)   . White matter abnormality on MRI of brain     Past Surgical History:  Procedure Laterality Date  . lumbar puncture    . NO PAST SURGERIES      Allergies  Allergen Reactions  . Naproxen     Other reaction(s): Dizziness    Family History  Problem Relation Age of Onset  . Heart disease Paternal Grandfather      Social History   Socioeconomic History  . Marital status: Married    Spouse name: Mikeal Hawthorne  . Number of children: 0  . Years of education: 72  . Highest education level: Not on file    Occupational History  . Occupation: Insurance underwriter  Social Needs  . Financial resource strain: Not on file  . Food insecurity:    Worry: Not on file    Inability: Not on file  . Transportation needs:    Medical: Not on file    Non-medical: Not on file  Tobacco Use  . Smoking status: Never Smoker  . Smokeless tobacco: Never Used  Substance and Sexual Activity  . Alcohol use: Yes    Comment: Socially  . Drug use: No  . Sexual activity: Yes  Lifestyle  . Physical activity:    Days per week: Not on file    Minutes per session: Not on file  . Stress: Not on file  Relationships  . Social connections:    Talks on phone: Not on file    Gets together: Not on file    Attends religious service: Not on file    Active member of club or organization: Not on file    Attends meetings of clubs or organizations: Not on file    Relationship status: Not on file  . Intimate partner violence:    Fear of current or ex partner: Not on file    Emotionally abused: Not on file    Physically abused: Not on file    Forced sexual activity: Not on file  Other Topics Concern  . Not on file  Social History Narrative  Lives at home with husband.   Caffeine use:  Drinks coffee daily   Right handed     PHYSICAL EXAM:  VS: BP 116/68 (BP Location: Left Arm, Patient Position: Sitting, Cuff Size: Normal)   Pulse 76   Ht 4\' 11"  (1.499 m)   Wt 108 lb (49 kg)   SpO2 99%   BMI 21.81 kg/m  Physical Exam Gen: NAD, alert, cooperative with exam, well-appearing ENT: normal lips, normal nasal mucosa,  Eye: normal EOM, normal conjunctiva and lids CV:  no edema, +2 pedal pulses   Resp: no accessory muscle use, non-labored,  Skin: no rashes, no areas of induration  Neuro: normal tone, normal sensation to touch Psych:  normal insight, alert and oriented MSK:  Right foot:  Hallux valgus  Normal great toe extension  Limited great toe flexion  TTP of the medial great toe  No overlying redness or redness.   Neurovascularly intact.    Aspiration/Injection Procedure Note Grace Calhoun 06-11-1990  Procedure: Injection Indications: right great toe   Procedure Details Consent: Risks of procedure as well as the alternatives and risks of each were explained to the (patient/caregiver).  Consent for procedure obtained. Time Out: Verified patient identification, verified procedure, site/side was marked, verified correct patient position, special equipment/implants available, medications/allergies/relevent history reviewed, required imaging and test results available.  Performed.  The area was cleaned with iodine and alcohol swabs.    The right 1st MTP joint was injected using 1 cc's of 40 mg Depomedrol and 1 cc's of 1% lidocaine with a 25 1 1/2" needle.  Ultrasound was used. Images were obtained in  Long views showing the injection.    A sterile dressing was applied.  Patient did tolerate procedure well.    ASSESSMENT & PLAN:   Great toe pain, right Has been ongoing for roughly 5 months. She may have exacerbated this by rock climbing. Limited padding on the plantar forefoot under the 1st MTP joint  - injection today  - counseled on padding of the bunion  - if no improvement consider smartec orthotics vs MRI

## 2017-06-11 NOTE — Assessment & Plan Note (Addendum)
Has been ongoing for roughly 5 months. She may have exacerbated this by rock climbing. Limited padding on the plantar forefoot under the 1st MTP joint  - injection today  - pennsaid  - counseled on padding of the bunion  - if no improvement consider smartec orthotics vs MRI

## 2017-06-11 NOTE — Patient Instructions (Addendum)
Nice to see you  Please try a cushion over this area.  Please try the medication over the area Please follow up to have orthotics made

## 2017-06-12 DIAGNOSIS — F418 Other specified anxiety disorders: Secondary | ICD-10-CM | POA: Diagnosis not present

## 2017-07-09 ENCOUNTER — Telehealth: Payer: Self-pay | Admitting: Internal Medicine

## 2017-07-09 NOTE — Telephone Encounter (Signed)
Copied from [place] [phone]. Topic: Quick Communication - Rx Refill/Question >> Jul 09, 2017 12:19 PM Oliver Pila B wrote: Medication: Diclofenac Sodium (PENNSAID) 2 % SOLN [[id number]]   [facility] called about the medication above to get a diagnosis code and to find out what else the pt has taking, contact [phone]

## 2017-07-10 NOTE — Telephone Encounter (Signed)
Contacted pharmacy, DX code provided

## 2017-08-19 DIAGNOSIS — F40218 Other animal type phobia: Secondary | ICD-10-CM | POA: Diagnosis not present

## 2017-09-09 DIAGNOSIS — F40218 Other animal type phobia: Secondary | ICD-10-CM | POA: Diagnosis not present

## 2017-09-23 DIAGNOSIS — F40218 Other animal type phobia: Secondary | ICD-10-CM | POA: Diagnosis not present

## 2017-10-02 ENCOUNTER — Other Ambulatory Visit: Payer: Self-pay | Admitting: Neurology

## 2017-10-02 ENCOUNTER — Telehealth: Payer: Self-pay | Admitting: Neurology

## 2017-10-02 DIAGNOSIS — G939 Disorder of brain, unspecified: Secondary | ICD-10-CM

## 2017-10-02 DIAGNOSIS — G379 Demyelinating disease of central nervous system, unspecified: Secondary | ICD-10-CM

## 2017-10-02 NOTE — Telephone Encounter (Signed)
I have the patient MRI scheduled for 12/18/17 patient requested that day. I also made a follow up appointment to see Dr. Jaynee Eagles on 01/08/18 patient also requested that day.

## 2017-10-02 NOTE — Telephone Encounter (Signed)
Sure, I have ordered it if we can get it approved by insurance she can schedule it for nov or dec thanks

## 2017-10-02 NOTE — Telephone Encounter (Signed)
Pt requesting a call, stating she is wanting her yearly MRI scheduled for November or December of this year if possible. Please advise. Pt aware she will also need to schedule a f/u

## 2017-10-02 NOTE — Telephone Encounter (Addendum)
Noted thank you

## 2017-10-07 DIAGNOSIS — F40218 Other animal type phobia: Secondary | ICD-10-CM | POA: Diagnosis not present

## 2017-10-10 ENCOUNTER — Encounter: Payer: Self-pay | Admitting: Internal Medicine

## 2017-10-10 MED ORDER — DIAZEPAM 5 MG PO TABS: 5.0000 mg | ORAL_TABLET | Freq: Two times a day (BID) | ORAL | 0 refills | Status: DC | PRN

## 2017-10-10 NOTE — Telephone Encounter (Signed)
Done erx 

## 2017-10-21 DIAGNOSIS — F40218 Other animal type phobia: Secondary | ICD-10-CM | POA: Diagnosis not present

## 2017-10-22 ENCOUNTER — Ambulatory Visit: Payer: BLUE CROSS/BLUE SHIELD

## 2017-12-09 ENCOUNTER — Other Ambulatory Visit: Payer: Self-pay | Admitting: Neurology

## 2017-12-09 MED ORDER — ALPRAZOLAM 0.25 MG PO TABS: ORAL_TABLET | ORAL | 0 refills | Status: DC

## 2017-12-09 NOTE — Telephone Encounter (Signed)
Noted, thank you

## 2017-12-09 NOTE — Telephone Encounter (Signed)
Called in to pharmacy thanks

## 2017-12-09 NOTE — Telephone Encounter (Signed)
Patient MRI is scheduled for 12/18/17. She also informed me she is claustrophobic and would need something to help her.

## 2017-12-18 ENCOUNTER — Ambulatory Visit: Payer: BLUE CROSS/BLUE SHIELD

## 2017-12-18 DIAGNOSIS — G379 Demyelinating disease of central nervous system, unspecified: Secondary | ICD-10-CM

## 2017-12-18 DIAGNOSIS — G939 Disorder of brain, unspecified: Secondary | ICD-10-CM | POA: Diagnosis not present

## 2017-12-18 MED ORDER — GADOBENATE DIMEGLUMINE 529 MG/ML IV SOLN
10.0000 mL | Freq: Once | INTRAVENOUS | Status: AC | PRN
  Administered 2017-12-18: 10 mL via INTRAVENOUS

## 2018-01-08 ENCOUNTER — Ambulatory Visit: Payer: BLUE CROSS/BLUE SHIELD | Admitting: Neurology

## 2018-01-21 ENCOUNTER — Ambulatory Visit: Payer: BLUE CROSS/BLUE SHIELD | Admitting: Neurology

## 2018-01-29 DIAGNOSIS — N760 Acute vaginitis: Secondary | ICD-10-CM | POA: Diagnosis not present

## 2018-01-29 DIAGNOSIS — N390 Urinary tract infection, site not specified: Secondary | ICD-10-CM | POA: Diagnosis not present

## 2018-03-17 ENCOUNTER — Other Ambulatory Visit: Payer: Self-pay | Admitting: Radiology

## 2018-03-17 DIAGNOSIS — N6320 Unspecified lump in the left breast, unspecified quadrant: Secondary | ICD-10-CM | POA: Diagnosis not present

## 2018-03-19 ENCOUNTER — Other Ambulatory Visit: Payer: Self-pay

## 2018-03-19 ENCOUNTER — Ambulatory Visit
Admission: RE | Admit: 2018-03-19 | Discharge: 2018-03-19 | Disposition: A | Discharge status: Home / Self Care | Payer: BLUE CROSS/BLUE SHIELD | Source: Ambulatory Visit | Attending: Radiology | Admitting: Radiology

## 2018-03-19 DIAGNOSIS — N6489 Other specified disorders of breast: Secondary | ICD-10-CM | POA: Diagnosis not present

## 2018-03-19 DIAGNOSIS — N6320 Unspecified lump in the left breast, unspecified quadrant: Secondary | ICD-10-CM

## 2018-03-20 ENCOUNTER — Other Ambulatory Visit: Payer: BLUE CROSS/BLUE SHIELD

## 2018-05-12 ENCOUNTER — Encounter: Payer: Self-pay | Admitting: Family Medicine

## 2018-05-12 ENCOUNTER — Ambulatory Visit: Payer: BLUE CROSS/BLUE SHIELD | Admitting: Family Medicine

## 2018-05-12 VITALS — BP 110/80 | HR 80 | Temp 98.8°F | Ht 59.0 in

## 2018-05-12 DIAGNOSIS — M357 Hypermobility syndrome: Secondary | ICD-10-CM | POA: Insufficient documentation

## 2018-05-12 DIAGNOSIS — R200 Anesthesia of skin: Secondary | ICD-10-CM

## 2018-05-12 NOTE — Assessment & Plan Note (Signed)
Beighton score 7/9.

## 2018-05-12 NOTE — Patient Instructions (Signed)
Good to see you  Please try either running or your workout video for a week and see if your symptoms  Please try a standing desk or changing posture at work  Please send me a message through [place] with updates or questions.  Please see me back in one month if no better.

## 2018-05-12 NOTE — Progress Notes (Signed)
Grace Calhoun - 28 y.o. female MRN [id number]  Date of birth: [birth year]  SUBJECTIVE:  Including CC & ROS.  Chief Complaint  Patient presents with  . Numbness    pins and needles in both legs/ on and off for couple weeks    Grace Calhoun is a 28 y.o. female that is presenting with bilateral posterior leg altered sensation.  This is been ongoing over the past few weeks.  She notices it more when she is running.  She has taken off the past few days from running and her symptoms have improved.  She denies any pain associated with it.  She denies any inciting event or trauma.  She feels it in the posterior calf and thighs.  She has altered her gait since having the great toe pain.  Has been having an MRI of her brain on a yearly basis following a possible demyelinating disease.  This is been ongoing for some time with no change to suspect any other demyelinating process.  Denies any other symptoms other than the ones described today.   Review of Systems  Constitutional: Negative for fever.  HENT: Negative for congestion.   Respiratory: Negative for cough.   Cardiovascular: Negative for chest pain.  Gastrointestinal: Negative for abdominal pain.  Musculoskeletal: Negative for back pain.  Skin: Negative for color change.  Neurological: Negative for weakness.  Hematological: Negative for adenopathy.  Psychiatric/Behavioral: Negative for agitation.    HISTORY: Past Medical, Surgical, Social, and Family History Reviewed & Updated per EMR.   Pertinent Historical Findings include:  Past Medical History:  Diagnosis Date  . Allergic rhinitis 11/13/2016  . Anxiety 11/13/2016  . Asthma   . Frequent headaches   . Heart murmur   . Migraines   . Ocular migraine   . UTI (urinary tract infection)   . White matter abnormality on MRI of brain     Past Surgical History:  Procedure Laterality Date  . lumbar puncture    . NO PAST SURGERIES      Allergies  Allergen Reactions  . Naproxen    Other reaction(s): Dizziness    Family History  Problem Relation Age of Onset  . Heart disease Paternal Grandfather   . Breast cancer Maternal Aunt      Social History   Socioeconomic History  . Marital status: Married    Spouse name: Mikeal Hawthorne  . Number of children: 0  . Years of education: 94  . Highest education level: Not on file  Occupational History  . Occupation: Insurance underwriter  Social Needs  . Financial resource strain: Not on file  . Food insecurity:    Worry: Not on file    Inability: Not on file  . Transportation needs:    Medical: Not on file    Non-medical: Not on file  Tobacco Use  . Smoking status: Never Smoker  . Smokeless tobacco: Never Used  Substance and Sexual Activity  . Alcohol use: Yes    Comment: Socially  . Drug use: No  . Sexual activity: Yes  Lifestyle  . Physical activity:    Days per week: Not on file    Minutes per session: Not on file  . Stress: Not on file  Relationships  . Social connections:    Talks on phone: Not on file    Gets together: Not on file    Attends religious service: Not on file    Active member of club or organization: Not on file    Attends  meetings of clubs or organizations: Not on file    Relationship status: Not on file  . Intimate partner violence:    Fear of current or ex partner: Not on file    Emotionally abused: Not on file    Physically abused: Not on file    Forced sexual activity: Not on file  Other Topics Concern  . Not on file  Social History Narrative   Lives at home with husband.   Caffeine use:  Drinks coffee daily   Right handed     PHYSICAL EXAM:  VS: BP 110/80   Pulse 80   Temp 98.8 F (37.1 C) (Oral)   Ht 4\' 11"  (1.499 m)   SpO2 98%   BMI 21.81 kg/m  Physical Exam Gen: NAD, alert, cooperative with exam, well-appearing ENT: normal lips, normal nasal mucosa,  Eye: normal EOM, normal conjunctiva and lids CV:  no edema, +2 pedal pulses   Resp: no accessory muscle use, non-labored,   Skin: no rashes, no areas of induration  Neuro: normal tone, normal sensation to touch Psych:  normal insight, alert and oriented MSK:  Left and Right leg:  No signs of atrophy  Normal IR and ER of the hips  Normal strength to resistance with hip flexion, knee flexion and extension, plantarfleixon and dorsalflexion  Normal gait  Neurovascularly intact   Beighton Score  Finger to forearm b/l  Pinky finger past 90 degress b/l  Mild hyperextension of b/l elbow  Little to no hyperextension of the knees  Can place hands flat on floor.       ASSESSMENT & PLAN:   Hypermobility syndrome Beighton score 7/9.   Leg numbness Possible for pain related to foot strike as she lands on the lateral forefoot and not much of a toe off the great toe. Related to her great toe injury from a few months ago. Possible for low ferritin. Has hypermobility with possible contribution. Not likely related to the white matter change observed on MRI.  - counseled on working out and monitoring of symptoms  - could consider checking ferritin, TSH, etc if continues  - can f/u in one month, could consider EMG.

## 2018-05-12 NOTE — Assessment & Plan Note (Signed)
Possible for pain related to foot strike as she lands on the lateral forefoot and not much of a toe off the great toe. Related to her great toe injury from a few months ago. Possible for low ferritin. Has hypermobility with possible contribution. Not likely related to the white matter change observed on MRI.  - counseled on working out and monitoring of symptoms  - could consider checking ferritin, TSH, etc if continues  - can f/u in one month, could consider EMG.

## 2018-06-17 ENCOUNTER — Encounter: Payer: Self-pay | Admitting: Family Medicine

## 2018-06-19 ENCOUNTER — Encounter: Payer: Self-pay | Admitting: *Deleted

## 2018-06-19 ENCOUNTER — Telehealth: Payer: Self-pay | Admitting: Neurology

## 2018-06-19 NOTE — Telephone Encounter (Signed)
Sent pt a Therapist, music. She has been seen by Dr. Raeford Razor for this. I suggested she continue to follow up with him and if he feels she needs neurology she can be referred for it.

## 2018-06-19 NOTE — Telephone Encounter (Signed)
Note pt read the mychart message today.

## 2018-06-19 NOTE — Telephone Encounter (Signed)
Pt states for several weeks she has been experiencing  Tingling and numbness in both legs.  Pt states sometimes it is worse with exercise.  Pt is asking for a call to discuss these symptoms and if she needs to come in.

## 2018-06-24 ENCOUNTER — Other Ambulatory Visit: Payer: Self-pay | Admitting: Family Medicine

## 2018-06-24 DIAGNOSIS — R200 Anesthesia of skin: Secondary | ICD-10-CM

## 2018-07-07 ENCOUNTER — Other Ambulatory Visit: Payer: Self-pay

## 2018-07-07 ENCOUNTER — Telehealth: Payer: Self-pay | Admitting: Neurology

## 2018-07-07 ENCOUNTER — Encounter: Payer: Self-pay | Admitting: Neurology

## 2018-07-07 ENCOUNTER — Ambulatory Visit: Payer: BLUE CROSS/BLUE SHIELD | Admitting: Neurology

## 2018-07-07 ENCOUNTER — Other Ambulatory Visit: Payer: Self-pay | Admitting: Neurology

## 2018-07-07 VITALS — BP 118/76 | HR 80 | Temp 98.4°F | Ht 59.0 in | Wt 113.0 lb

## 2018-07-07 DIAGNOSIS — R200 Anesthesia of skin: Secondary | ICD-10-CM | POA: Diagnosis not present

## 2018-07-07 DIAGNOSIS — R350 Frequency of micturition: Secondary | ICD-10-CM | POA: Diagnosis not present

## 2018-07-07 DIAGNOSIS — R29818 Other symptoms and signs involving the nervous system: Secondary | ICD-10-CM

## 2018-07-07 DIAGNOSIS — R799 Abnormal finding of blood chemistry, unspecified: Secondary | ICD-10-CM | POA: Diagnosis not present

## 2018-07-07 DIAGNOSIS — E531 Pyridoxine deficiency: Secondary | ICD-10-CM | POA: Diagnosis not present

## 2018-07-07 DIAGNOSIS — E519 Thiamine deficiency, unspecified: Secondary | ICD-10-CM | POA: Diagnosis not present

## 2018-07-07 DIAGNOSIS — R29898 Other symptoms and signs involving the musculoskeletal system: Secondary | ICD-10-CM

## 2018-07-07 DIAGNOSIS — R202 Paresthesia of skin: Secondary | ICD-10-CM

## 2018-07-07 DIAGNOSIS — H5462 Unqualified visual loss, left eye, normal vision right eye: Secondary | ICD-10-CM | POA: Diagnosis not present

## 2018-07-07 DIAGNOSIS — G35 Multiple sclerosis: Secondary | ICD-10-CM

## 2018-07-07 DIAGNOSIS — R258 Other abnormal involuntary movements: Secondary | ICD-10-CM

## 2018-07-07 DIAGNOSIS — E539 Vitamin B deficiency, unspecified: Secondary | ICD-10-CM | POA: Diagnosis not present

## 2018-07-07 MED ORDER — ALPRAZOLAM 0.25 MG PO TABS: ORAL_TABLET | ORAL | 0 refills | Status: AC

## 2018-07-07 NOTE — Telephone Encounter (Signed)
I spoke to the patient she is aware.  °

## 2018-07-07 NOTE — Progress Notes (Addendum)
[facility]    Provider:  Dr Jaynee Eagles  CC:  Low grade Glioma vs demyelination of cerebellum  Interval update July 08, 2018.  This is a 28 year old female with a history of intra-axial 2 x 2 centimeter mass in the right cerebellum unknown etiology, possible low-grade glioma versus demyelination has been postulated.  Today she returns with new symptoms. We referred her to [facility] and she was not contacted. She is a Licensed conveyancer. Symptoms are worse with stress so she understands may be anxiety however needs to be evaluated. She has had episodes of numbness and tingling in the legs and feet. It started slowly and progressively worsened and then improved. Worse with walking and running. Rest and in the morning would be better. No back pain. Right leg >> left. Both arms right >> left tingling.  Mouth went numb. Also tingling of the face unilaterally. Frequent urination, also temporary vision loss of the left eye.  Interval update: 28 year old female with history of an intra-axial 2*2 cm mass right cerebellum possibly low-grade glioma. Patient with vertigo and positional headaches. Need follow up MRI brain as well as referral to [facility] neurology for possible biopsy or surgical treatment if clinically warranted.     HPI:  Monisha Siebel is a 28 y.o. female here as a referral from Dr. Lacey Jensen for dizziness, ocular migraines. Past medical history of abnormal white matter changes on MRI, ocular migraines. She says she was diagnosed with possible MS or demyelinating disease and she has been under surveillance every 6 months. She has had 2 LPs. She was at work and she had an episode of left eye vision changes with diplopia and vertigo in 10/2013 which was the initial onset of symptoms without any inciting event or known trigger or head trauma . The double vision lasted less than a minute. She had residual light headedness. She had another episode a few weeks ago and she was sitting down and  she felt like her head "slung forward" her eyesight "flung forward" but her head didn't move and then she experienced a severe sharp headache on both sides. Unknown triggers, no vertigo, just feeling spacey and fatigue. She is in grad school. She sees a neurologist in [place] ([facility] and she went to [facility]). Both episodes less than a minute. She has residual , dizziness, blurry vision. No other symptoms. She is under stress. She gets "hazy" a lot. She denies any current double vision,  numbness, tingling, weakness, no urination problems. No FHx of MS, aunt has an autoimmune disorder. No other associated symptoms or modifying factors. She continues to have dizziness and lightheadedness and blurry vision continuously. She has not had an ocular migraine for a while.   Reviewed notes, labs and imaging from outside physicians, which showed:   Reviewed notes her primary care. Patient recently moved to the area from [place]. In October 2015 she had an episode of vertigo and double vision. She had 2 lumbar punctures performed. Working diagnosis was multiple sclerosis per patient results were inconclusive from lumbar punctures. She did have a scan and reported that she has a "lesion in my brain" that is being followed with serial MRIs every 6 months. Last MRI was April 2017. Patient stated this episode was different than her original one, she was sitting down and had a sensation that her head had been flung forward and then back again, vision suddenly went down than up again, sensation followed by a very brief head pain  and then residual lightheadedness lasting 2-3 minutes. She has a history of ocular migraines with aura and hip pain. .   Review of Systems: Patient complains of symptoms per HPI as well as the following symptoms: No CP, no SOB, double vision, vertigo. Pertinent negatives per HPI. All others negative.  Social History   Socioeconomic  History  . Marital status: Married    Spouse name: Mikeal Hawthorne  . Number of children: 0  . Years of education: 11  . Highest education level: Not on file  Occupational History  . Occupation: Insurance underwriter  Social Needs  . Financial resource strain: Not on file  . Food insecurity    Worry: Not on file    Inability: Not on file  . Transportation needs    Medical: Not on file    Non-medical: Not on file  Tobacco Use  . Smoking status: Never Smoker  . Smokeless tobacco: Never Used  Substance and Sexual Activity  . Alcohol use: Yes    Comment: Socially  . Drug use: No  . Sexual activity: Yes    Birth control/protection: I.U.D.  Lifestyle  . Physical activity    Days per week: Not on file    Minutes per session: Not on file  . Stress: Not on file  Relationships  . Social Herbalist on phone: Not on file    Gets together: Not on file    Attends religious service: Not on file    Active member of club or organization: Not on file    Attends meetings of clubs or organizations: Not on file    Relationship status: Not on file  . Intimate partner violence    Fear of current or ex partner: Not on file    Emotionally abused: Not on file    Physically abused: Not on file    Forced sexual activity: Not on file  Other Topics Concern  . Not on file  Social History Narrative   Lives at home with husband.   Caffeine use:  Drinks coffee daily   Right handed    Family History  Problem Relation Age of Onset  . Heart disease Paternal Grandfather   . Breast cancer Maternal Aunt     Past Medical History:  Diagnosis Date  . Allergic rhinitis 11/13/2016  . Anxiety 11/13/2016  . Asthma   . Frequent headaches   . Heart murmur   . Migraines   . Ocular migraine   . UTI (urinary tract infection)   . White matter abnormality on MRI of brain     Past Surgical History:  Procedure Laterality Date  . lumbar puncture    . NO PAST SURGERIES      Current Outpatient Medications   Medication Sig Dispense Refill  . acetaminophen (TYLENOL) 500 MG tablet Take 500-1,000 mg by mouth as needed.    Marland Kitchen ibuprofen (ADVIL,MOTRIN) 200 MG tablet Take 200 mg by mouth every 6 (six) hours as needed.    Marland Kitchen VALERIAN PO Take 1 Dose by mouth 3 (three) times a week. Takes as needed    . Diclofenac Sodium (PENNSAID) 2 % SOLN Place 1 application onto the skin 2 (two) times daily. (Patient not taking: Reported on 05/12/2018) 1 Bottle 3   No current facility-administered medications for this visit.    Facility-Administered Medications Ordered in Other Visits  Medication Dose Route Frequency Provider Last Rate Last Dose  . gadopentetate dimeglumine (MAGNEVIST) injection 10 mL  10 mL Intravenous Once  PRN Melvenia Beam, MD        Allergies as of 07/07/2018 - Review Complete 07/07/2018  Allergen Reaction Noted  . Naproxen  02/13/2016    Vitals: BP 118/76 (BP Location: Right Arm, Patient Position: Sitting)   Pulse 80   Temp 98.4 F (36.9 C) Comment: taken by front staff  Ht 4\' 11"  (1.499 m)   Wt 113 lb (51.3 kg)   BMI 22.82 kg/m  Last Weight:  Wt Readings from Last 1 Encounters:  07/07/18 113 lb (51.3 kg)   Last Height:   Ht Readings from Last 1 Encounters:  07/07/18 4\' 11"  (1.499 m)    Physical exam: Exam: Gen: NAD, conversant, well nourised, well groomed                     CV: RRR, no MRG. No Carotid Bruits. No peripheral edema, warm, nontender Eyes: Conjunctivae clear without exudates or hemorrhage  Neuro: Detailed Neurologic Exam  Speech:    Speech is normal; fluent and spontaneous with normal comprehension.  Cognition:    The patient is oriented to person, place, and time;     recent and remote memory intact;     language fluent;     normal attention, concentration,     fund of knowledge Cranial Nerves:    The pupils are equal, round, and reactive to light. The fundi are normal and spontaneous venous pulsations are present. Visual fields are full to finger  confrontation. Extraocular movements are intact. Trigeminal sensation is intact and the muscles of mastication are normal. The face is symmetric. The palate elevates in the midline. Hearing intact. Voice is normal. Shoulder shrug is normal. The tongue has normal motion without fasciculations.   Coordination:    Normal finger to nose and heel to shin. Normal rapid alternating movements.   Gait:    Heel-toe and tandem gait are normal.   Motor Observation:    No asymmetry, no atrophy, and no involuntary movements noted. Tone:    Normal muscle tone.    Posture:    Posture is normal. normal erect    Strength: Left hip flexion 4+/5, otherwise strength is V/V in the upper and lower limbs.      Sensation: intact to LT     Reflex Exam:  DTR's:    Deep tendon reflexes in the upper and lower extremities are normal bilaterally.   Toes:    The toes are equiv bilaterally.   Clonus:    Clonus patellars.      Assessment/Plan:  28 year old female with history of an intra-axial 2*2 cm mass right cerebellum possibly low-grade glioma vs demyelination/MS. Brisk reflexes with clonus. Needs evaluation for demyelination such as MS given her new neurologic symptoms which are concerning such as paresthesias in limbs, tingling in the face, vision loss left eye, focal weakness on exam.    Addendum 02/12/2016: MRI of the brain 10/2013 and 03/2015 showed a stable intra-axial mass within the right cerebellum measuring 1.6 x 2cm. Given new symptoms needs a thorough exam with f/u of MRI brain and cervical spine for MS.   Consider emg/ncs of left arm and right leg     Cc: Antoine Primas, MD  Orders Placed This Encounter  Procedures  . MR BRAIN W WO CONTRAST  . MR CERVICAL SPINE W WO CONTRAST  . CBC with Differential/Platelets  . Comprehensive metabolic panel  . TSH  . B12 and Folate Panel  . Methylmalonic acid, serum  . Vitamin  B1  . Sjogren's syndrome antibods(ssa + ssb)  . Pan-ANCA  .  Rheumatoid factor  . Heavy metals, blood  . Vitamin B6  . ANA  . ANA, IFA (with reflex)  . BClaris Che Antibody    Sarina Ill, MD  [facility] [address] [place], [place] [address]  Phone [phone] Fax [phone] A total of 40 minutes was spent face-to-face with this patient. Over half this time was spent on counseling patient on the  1. Paresthesia of upper and lower extremity   2. Numbness around mouth   3. Tingling of face   4. Frequent urination   5. Vision loss of left eye   6. Clonus   7. Left leg weakness   8. Transient neurological symptoms   9. Multiple sclerosis ([place])    diagnosis and different diagnostic and therapeutic options, counseling and coordination of care, risks ans benefits of management, compliance, or risk factor reduction and education.

## 2018-07-07 NOTE — Telephone Encounter (Signed)
Xanax sent to [facility] on [address] thanks!

## 2018-07-07 NOTE — Telephone Encounter (Signed)
no to the covid-19 questions MR Brain w/wo contrast & MR Cervical spine w/wo contrast Dr. Ihor Dow Auth: [place] via bcbs website. Patient is scheduled at [facility] for 07/09/18.  Patient also informed me she is claustrophobic and she will need something to help her. She is aware to have a driver.

## 2018-07-07 NOTE — Addendum Note (Signed)
Addended by: Sarina Ill B on: 07/07/2018 09:20 AM   Modules accepted: Orders

## 2018-07-07 NOTE — Patient Instructions (Signed)
MRI brain and cervical spine Blood work Consider EMG/NCS if needed   Electromyoneurogram Electromyoneurogram is a test to check how well your muscles and nerves are working. This procedure includes the combined use of electromyogram (EMG) and nerve conduction study (NCS). EMG is used to look for muscular disorders. NCS, which is also called electroneurogram, measures how well your nerves are controlling your muscles. The procedures are usually done together to check if your muscles and nerves are healthy. If the results of the tests are abnormal, this may indicate disease or injury, such as a neuromuscular disease or peripheral nerve damage. Tell a health care provider about:  Any allergies you have.  All medicines you are taking, including vitamins, herbs, eye drops, creams, and over-the-counter medicines.  Any problems you or family members have had with anesthetic medicines.  Any blood disorders you have.  Any surgeries you have had.  Any medical conditions you have.  If you have a pacemaker.  Whether you are pregnant or may be pregnant. What are the risks? Generally, this is a safe procedure. However, problems may occur, including:  Infection where the electrodes were inserted.  Bleeding. What happens before the procedure? Medicines Ask your health care provider about:  Changing or stopping your regular medicines. This is especially important if you are taking diabetes medicines or blood thinners.  Taking medicines such as aspirin and ibuprofen. These medicines can thin your blood. Do not take these medicines unless your health care provider tells you to take them.  Taking over-the-counter medicines, vitamins, herbs, and supplements. General instructions  Your health care provider may ask you to avoid: ? Beverages that have caffeine, such as coffee and tea. ? Any products that contain nicotine or tobacco. These products include cigarettes, e-cigarettes, and chewing  tobacco. If you need help quitting, ask your health care provider.  Do not use lotions or creams on the same day that you will be having the procedure. What happens during the procedure? For EMG   Your health care provider will ask you to stay in a position so that he or she can access the muscle that will be studied. You may be standing, sitting, or lying down.  You may be given a medicine that numbs the area (local anesthetic).  A very thin needle that has an electrode will be inserted into your muscle.  Another small electrode will be placed on your skin near the muscle.  Your health care provider will ask you to continue to remain still.  The electrodes will send a signal that tells about the electrical activity of your muscles. You may see this on a monitor or hear it in the room.  After your muscles have been studied at rest, your health care provider will ask you to contract or flex your muscles. The electrodes will send a signal that tells about the electrical activity of your muscles.  Your health care provider will remove the electrodes and the electrode needles when the procedure is finished. The procedure may vary among health care providers and hospitals. For NCS   An electrode that records your nerve activity (recording electrode) will be placed on your skin by the muscle that is being studied.  An electrode that is used as a reference (reference electrode) will be placed near the recording electrode.  A paste or gel will be applied to your skin between the recording electrode and the reference electrode.  Your nerve will be stimulated with a mild shock. Your health care  provider will measure how much time it takes for your muscle to react.  Your health care provider will remove the electrodes and the gel when the procedure is finished. The procedure may vary among health care providers and hospitals. What happens after the procedure?  It is up to you to get the  results of your procedure. Ask your health care provider, or the department that is doing the procedure, when your results will be ready.  Your health care provider may: ? Give you medicines for any pain. ? Monitor the insertion sites to make sure that bleeding stops. Summary  Electromyoneurogram is a test to check how well your muscles and nerves are working.  If the results of the tests are abnormal, this may indicate disease or injury.  This is a safe procedure. However, problems may occur, such as bleeding and infection.  Your health care provider will do two tests to complete this procedure. One checks your muscles (EMG) and another checks your nerves (NCS).  It is up to you to get the results of your procedure. Ask your health care provider, or the department that is doing the procedure, when your results will be ready. This information is not intended to replace advice given to you by your health care provider. Make sure you discuss any questions you have with your health care provider. Document Released: 04/20/2004 Document Revised: 09/03/2017 Document Reviewed: 08/16/2017 Elsevier Patient Education  2020 [employer].

## 2018-07-08 ENCOUNTER — Other Ambulatory Visit (INDEPENDENT_AMBULATORY_CARE_PROVIDER_SITE_OTHER): Payer: Self-pay

## 2018-07-08 ENCOUNTER — Other Ambulatory Visit: Payer: Self-pay | Admitting: Neurology

## 2018-07-08 ENCOUNTER — Ambulatory Visit (INDEPENDENT_AMBULATORY_CARE_PROVIDER_SITE_OTHER): Payer: BC Managed Care – PPO | Admitting: *Deleted

## 2018-07-08 ENCOUNTER — Telehealth: Payer: Self-pay | Admitting: Neurology

## 2018-07-08 ENCOUNTER — Other Ambulatory Visit: Payer: Self-pay

## 2018-07-08 DIAGNOSIS — E538 Deficiency of other specified B group vitamins: Secondary | ICD-10-CM

## 2018-07-08 DIAGNOSIS — Z0289 Encounter for other administrative examinations: Secondary | ICD-10-CM

## 2018-07-08 LAB — B. BURGDORFI ANTIBODIES: Lyme IgG/IgM Ab: <0.91 {ISR} (ref 0.00–0.90)

## 2018-07-08 MED ORDER — CYANOCOBALAMIN 1000 MCG/ML IJ SOLN
1000.0000 ug | Freq: Once | INTRAMUSCULAR | Status: AC
  Administered 2018-07-08: 17:00:00 1000 ug via INTRAMUSCULAR

## 2018-07-08 NOTE — Progress Notes (Signed)
Patient given B12 1000 mcg IM injection x 1 per v.o. Dr. Jaynee Eagles (after labs were drawn). Pt tolerated injection well. Bandaid applied. See MAR. Questions answered.

## 2018-07-08 NOTE — Telephone Encounter (Signed)
Patient with symptomatic  b12 deficiency. Called patient. Recommend 1030mcg b12 shots weekly for 4 weeks and then once a month for 6 months then PO and monitor.   B12 deficiency can cause weakness, fatigue, easy bruising or bleeding,sore tongue, stomach upset, weight loss, and diarrhea or constipation, tingling or numbness to the fingers and toes, difficulty walking, mood changes, depression, memory loss, disorientation and, in severe cases, dementia.

## 2018-07-09 ENCOUNTER — Other Ambulatory Visit: Payer: Self-pay

## 2018-07-09 ENCOUNTER — Ambulatory Visit: Payer: BC Managed Care – PPO

## 2018-07-09 DIAGNOSIS — R202 Paresthesia of skin: Secondary | ICD-10-CM

## 2018-07-09 DIAGNOSIS — R200 Anesthesia of skin: Secondary | ICD-10-CM | POA: Diagnosis not present

## 2018-07-09 DIAGNOSIS — G35 Multiple sclerosis: Secondary | ICD-10-CM | POA: Diagnosis not present

## 2018-07-09 LAB — SPECIMEN STATUS

## 2018-07-09 MED ORDER — GADOBENATE DIMEGLUMINE 529 MG/ML IV SOLN
10.0000 mL | Freq: Once | INTRAVENOUS | Status: AC | PRN
  Administered 2018-07-09: 10 mL via INTRAVENOUS

## 2018-07-11 LAB — CBC WITH DIFFERENTIAL/PLATELET
Basophils Absolute: 0 10*3/uL (ref 0.0–0.2)
Basos: 0 %
EOS (ABSOLUTE): 0.1 10*3/uL (ref 0.0–0.4)
Eos: 2 %
Hematocrit: 42.8 % (ref 34.0–46.6)
Hemoglobin: 14.6 g/dL (ref 11.1–15.9)
Immature Grans (Abs): 0 10*3/uL (ref 0.0–0.1)
Immature Granulocytes: 0 %
Lymphocytes Absolute: 1.7 10*3/uL (ref 0.7–3.1)
Lymphs: 24 %
MCH: 31.5 pg (ref 26.6–33.0)
MCHC: 34.1 g/dL (ref 31.5–35.7)
MCV: 92 fL (ref 79–97)
Monocytes Absolute: 0.6 10*3/uL (ref 0.1–0.9)
Monocytes: 8 %
Neutrophils Absolute: 4.8 10*3/uL (ref 1.4–7.0)
Neutrophils: 66 %
Platelets: 245 10*3/uL (ref 150–450)
RBC: 4.64 x10E6/uL (ref 3.77–5.28)
RDW: 11.8 % (ref 11.7–15.4)
WBC: 7.2 10*3/uL (ref 3.4–10.8)

## 2018-07-11 LAB — B12 AND FOLATE PANEL
Folate: >20 ng/mL (ref >3.0)
Vitamin B-12: 210 pg/mL — ABNORMAL LOW (ref 232–1245)

## 2018-07-11 LAB — PAN-ANCA
ANCA Proteinase 3: <3.5 U/mL (ref 0.0–3.5)
Atypical pANCA: <1:20 {titer}
C-ANCA: <1:20 {titer}
Myeloperoxidase Ab: <9 U/mL (ref 0.0–9.0)
P-ANCA: <1:20 {titer}

## 2018-07-11 LAB — COMPREHENSIVE METABOLIC PANEL
ALT: 8 IU/L (ref 0–32)
AST: 14 IU/L (ref 0–40)
Albumin/Globulin Ratio: 2.2 (ref 1.2–2.2)
Albumin: 4.8 g/dL (ref 3.9–5.0)
Alkaline Phosphatase: 75 IU/L (ref 39–117)
BUN/Creatinine Ratio: 12 (ref 9–23)
BUN: 9 mg/dL (ref 6–20)
Bilirubin Total: 0.5 mg/dL (ref 0.0–1.2)
CO2: 23 mmol/L (ref 20–29)
Calcium: 9.9 mg/dL (ref 8.7–10.2)
Chloride: 101 mmol/L (ref 96–106)
Creatinine, Ser: 0.74 mg/dL (ref 0.57–1.00)
GFR calc Af Amer: 129 mL/min/{1.73_m2} (ref >59)
GFR calc non Af Amer: 112 mL/min/{1.73_m2} (ref >59)
Globulin, Total: 2.2 g/dL (ref 1.5–4.5)
Glucose: 73 mg/dL (ref 65–99)
Potassium: 4.3 mmol/L (ref 3.5–5.2)
Sodium: 138 mmol/L (ref 134–144)
Total Protein: 7 g/dL (ref 6.0–8.5)

## 2018-07-11 LAB — SJOGREN'S SYNDROME ANTIBODS(SSA + SSB)
ENA SSA (RO) Ab: <0.2 AI (ref 0.0–0.9)
ENA SSB (LA) Ab: <0.2 AI (ref 0.0–0.9)

## 2018-07-11 LAB — ANA: ANA Titer 1: NEGATIVE

## 2018-07-11 LAB — VITAMIN B6: Vitamin B6: 44.2 ug/L — ABNORMAL HIGH (ref 2.0–32.8)

## 2018-07-11 LAB — TSH: TSH: 3.85 u[IU]/mL (ref 0.450–4.500)

## 2018-07-11 LAB — METHYLMALONIC ACID, SERUM: Methylmalonic Acid: 271 nmol/L (ref 0–378)

## 2018-07-11 LAB — HEAVY METALS, BLOOD
Arsenic: 6 ug/L (ref 2–23)
Lead, Blood: NOT DETECTED ug/dL (ref 0–4)
Mercury: 2.1 ug/L (ref 0.0–14.9)

## 2018-07-11 LAB — RHEUMATOID FACTOR: Rheumatoid fact SerPl-aCnc: <10 IU/mL (ref 0.0–13.9)

## 2018-07-11 LAB — VITAMIN B1: Thiamine: 143.4 nmol/L (ref 66.5–200.0)

## 2018-07-13 LAB — ANTI-PARIETAL ANTIBODY: Parietal Cell Ab: 2.9 Units (ref 0.0–20.0)

## 2018-07-13 LAB — INTRINSIC FACTOR ANTIBODIES: Intrinsic Factor Abs, Serum: 1.1 AU/mL (ref 0.0–1.1)

## 2018-07-13 LAB — METHYLMALONIC ACID, SERUM: Methylmalonic Acid: 441 nmol/L — ABNORMAL HIGH (ref 0–378)

## 2018-07-13 LAB — B12 AND FOLATE PANEL
Folate: >20 ng/mL (ref >3.0)
Vitamin B-12: 226 pg/mL — ABNORMAL LOW (ref 232–1245)

## 2018-07-15 ENCOUNTER — Other Ambulatory Visit: Payer: Self-pay

## 2018-07-15 ENCOUNTER — Ambulatory Visit (INDEPENDENT_AMBULATORY_CARE_PROVIDER_SITE_OTHER): Payer: BC Managed Care – PPO | Admitting: Internal Medicine

## 2018-07-15 ENCOUNTER — Other Ambulatory Visit: Payer: BC Managed Care – PPO

## 2018-07-15 ENCOUNTER — Encounter: Payer: Self-pay | Admitting: Internal Medicine

## 2018-07-15 DIAGNOSIS — E538 Deficiency of other specified B group vitamins: Secondary | ICD-10-CM

## 2018-07-15 HISTORY — DX: Deficiency of other specified B group vitamins: E53.8

## 2018-07-15 MED ORDER — CYANOCOBALAMIN 1000 MCG/ML IJ SOLN
1000.0000 ug | Freq: Once | INTRAMUSCULAR | Status: AC
  Administered 2018-07-15: 1000 ug via INTRAMUSCULAR

## 2018-07-15 NOTE — Assessment & Plan Note (Addendum)
Unusual finding of deficiency unlikely to diet related I think; for intrinsic factor ab, but if neg would need b12 weekly x 4 wks, then monthly for 6 mo, but if positive/abnormal would simply need monthly b12 indefinitely for presumed perniscious type b12 deficiency

## 2018-07-15 NOTE — Progress Notes (Signed)
Subjective:    Patient ID: Grace Calhoun, female    DOB: [birth year], 28 y.o.   MRN: [id number]  HPI  Here to f/u after recent neurology evaluation where MS was ruled out, but did find symptomatic B12 deficiency, needs intensive tx.  Pt denies chest pain, increased sob or doe, wheezing, orthopnea, PND, increased LE swelling, palpitations, dizziness or syncope.   Pt denies polydipsia, polyuria Past Medical History:  Diagnosis Date   Allergic rhinitis 11/13/2016   Anxiety 11/13/2016   Asthma    B12 deficiency 07/15/2018   Frequent headaches    Heart murmur    Migraines    Ocular migraine    UTI (urinary tract infection)    White matter abnormality on MRI of brain    Past Surgical History:  Procedure Laterality Date   lumbar puncture     NO PAST SURGERIES      reports that she has never smoked. She has never used smokeless tobacco. She reports current alcohol use. She reports that she does not use drugs. family history includes Breast cancer in her maternal aunt; Heart disease in her paternal grandfather. Allergies  Allergen Reactions   Naproxen     Other reaction(s): Dizziness   Current Outpatient Medications on File Prior to Visit  Medication Sig Dispense Refill   acetaminophen (TYLENOL) 500 MG tablet Take 500-1,000 mg by mouth as needed.     ALPRAZolam (XANAX) 0.25 MG tablet Take 1-2 tabs (0.25mg -0.50mg ) 30-60 minutes before procedure. May repeat if needed.Do not drive. 4 tablet 0   Diclofenac Sodium (PENNSAID) 2 % SOLN Place 1 application onto the skin 2 (two) times daily. 1 Bottle 3   ibuprofen (ADVIL,MOTRIN) 200 MG tablet Take 200 mg by mouth every 6 (six) hours as needed.     VALERIAN PO Take 1 Dose by mouth 3 (three) times a week. Takes as needed     Current Facility-Administered Medications on File Prior to Visit  Medication Dose Route Frequency Provider Last Rate Last Dose   gadopentetate dimeglumine (MAGNEVIST) injection 10 mL  10 mL Intravenous Once  PRN Melvenia Beam, MD       Review of Systems  Constitutional: Negative for other unusual diaphoresis or sweats HENT: Negative for ear discharge or swelling Eyes: Negative for other worsening visual disturbances Respiratory: Negative for stridor or other swelling  Gastrointestinal: Negative for worsening distension or other blood Genitourinary: Negative for retention or other urinary change Musculoskeletal: Negative for other MSK pain or swelling Skin: Negative for color change or other new lesions Neurological: Negative for worsening tremors and other numbness  Psychiatric/Behavioral: Negative for worsening agitation or other fatigue All other system neg pe rpt    Objective:   Physical Exam Pulse 72    Temp 98.8 F (37.1 C) (Oral)    Ht 4\' 11"  (1.499 m)    Wt 111 lb (50.3 kg)    SpO2 99%    BMI 22.42 kg/m  VS noted,  Constitutional: Pt appears in NAD HENT: Head: NCAT.  Right Ear: External ear normal.  Left Ear: External ear normal.  Eyes: . Pupils are equal, round, and reactive to light. Conjunctivae and EOM are normal Nose: without d/c or deformity Neck: Neck supple. Gross normal ROM Cardiovascular: Normal rate and regular rhythm.   Pulmonary/Chest: Effort normal and breath sounds without rales or wheezing.  Neurological: Pt is alert. At baseline orientation, motor grossly intact Skin: Skin is warm. No rashes, other new lesions, no LE edema Psychiatric: Pt behavior  is normal without agitation  No other exam findings Lab Results  Component Value Date   WBC WILL FOLLOW 07/08/2018   HGB WILL FOLLOW 07/08/2018   HCT WILL FOLLOW 07/08/2018   PLT WILL FOLLOW 07/08/2018   GLUCOSE 73 07/07/2018   ALT 8 07/07/2018   AST 14 07/07/2018   NA 138 07/07/2018   K 4.3 07/07/2018   CL 101 07/07/2018   CREATININE 0.74 07/07/2018   BUN 9 07/07/2018   CO2 23 07/07/2018   TSH 3.850 07/07/2018       Assessment & Plan:

## 2018-07-15 NOTE — Patient Instructions (Addendum)
You had the B12 shot today  Please return for a weekly NURSE visit for 3 more times for the shot, then monthly for a total of 6 mo  We will check an unusual test called Intrinsic Factor Antibody, as if this is positive it would be unlikely to be able to change to the oral B12 after the shots, but instead would need to continue the shots indefinitely  Please continue all other medications as before, and refills have been done if requested.  Please have the pharmacy call with any other refills you may need.  Please keep your appointments with your specialists as you may have planned  Please go to the LAB in the Basement (turn left off the elevator) for the tests to be done today  You will be contacted by phone if any changes need to be made immediately.  Otherwise, you will receive a letter about your results with an explanation, but please check with MyChart first.  Please remember to sign up for MyChart if you have not done so, as this will be important to you in the future with finding out test results, communicating by private email, and scheduling acute appointments online when needed.

## 2018-07-18 LAB — INTRINSIC FACTOR ANTIBODIES: Intrinsic Factor: POSITIVE — AB

## 2018-07-19 ENCOUNTER — Encounter: Payer: Self-pay | Admitting: Internal Medicine

## 2018-07-19 DIAGNOSIS — E538 Deficiency of other specified B group vitamins: Secondary | ICD-10-CM

## 2018-07-23 ENCOUNTER — Ambulatory Visit (INDEPENDENT_AMBULATORY_CARE_PROVIDER_SITE_OTHER): Payer: BC Managed Care – PPO

## 2018-07-23 ENCOUNTER — Other Ambulatory Visit: Payer: Self-pay

## 2018-07-23 DIAGNOSIS — E538 Deficiency of other specified B group vitamins: Secondary | ICD-10-CM

## 2018-07-23 MED ORDER — CYANOCOBALAMIN 1000 MCG/ML IJ SOLN
1000.0000 ug | Freq: Once | INTRAMUSCULAR | Status: AC
  Administered 2018-07-23: 1000 ug via INTRAMUSCULAR

## 2018-07-23 NOTE — Progress Notes (Signed)
Medical screening examination/treatment/procedure(s) were performed by non-physician practitioner and as supervising physician I was immediately available for consultation/collaboration. I agree with above. Luna Audia, MD   

## 2018-07-29 DIAGNOSIS — Z20828 Contact with and (suspected) exposure to other viral communicable diseases: Secondary | ICD-10-CM | POA: Diagnosis not present

## 2018-07-30 ENCOUNTER — Ambulatory Visit (INDEPENDENT_AMBULATORY_CARE_PROVIDER_SITE_OTHER): Payer: BC Managed Care – PPO

## 2018-07-30 ENCOUNTER — Other Ambulatory Visit: Payer: Self-pay

## 2018-07-30 DIAGNOSIS — E538 Deficiency of other specified B group vitamins: Secondary | ICD-10-CM | POA: Diagnosis not present

## 2018-07-30 MED ORDER — CYANOCOBALAMIN 1000 MCG/ML IJ SOLN
1000.0000 ug | Freq: Once | INTRAMUSCULAR | Status: AC
  Administered 2018-07-30: 1000 ug via INTRAMUSCULAR

## 2018-07-30 NOTE — Progress Notes (Signed)
Medical screening examination/treatment/procedure(s) were performed by non-physician practitioner and as supervising physician I was immediately available for consultation/collaboration. I agree with above. Assia Meanor, MD   

## 2018-08-06 ENCOUNTER — Ambulatory Visit (INDEPENDENT_AMBULATORY_CARE_PROVIDER_SITE_OTHER): Payer: BC Managed Care – PPO

## 2018-08-06 ENCOUNTER — Other Ambulatory Visit: Payer: Self-pay

## 2018-08-06 ENCOUNTER — Telehealth: Payer: Self-pay

## 2018-08-06 DIAGNOSIS — E538 Deficiency of other specified B group vitamins: Secondary | ICD-10-CM

## 2018-08-06 MED ORDER — CYANOCOBALAMIN 1000 MCG/ML IJ SOLN: 1000.0000 ug | INTRAMUSCULAR | 3 refills | Status: AC

## 2018-08-06 MED ORDER — CYANOCOBALAMIN 1000 MCG/ML IJ SOLN
1000.0000 ug | Freq: Once | INTRAMUSCULAR | Status: AC
  Administered 2018-08-06: 1000 ug via INTRAMUSCULAR

## 2018-08-06 NOTE — Telephone Encounter (Signed)
Shots done erx to [employer]

## 2018-08-06 NOTE — Progress Notes (Signed)
Medical screening examination/treatment/procedure(s) were performed by non-physician practitioner and as supervising physician I was immediately available for consultation/collaboration. I agree with above. James John, MD   

## 2018-08-06 NOTE — Addendum Note (Signed)
Addended by: Biagio Borg on: 08/06/2018 05:04 PM   Modules accepted: Orders

## 2018-08-06 NOTE — Telephone Encounter (Signed)
Routing to dr Jenny Reichmann, patient would like to know if she could learn how to administer b12 injections to herself (nurse visit teaching with tamara) and begin doing this at home---if ok with you, can you send rx for b12 to her pharmacy, please advise, I will call and let the patient know, thanks

## 2018-08-07 NOTE — Telephone Encounter (Signed)
lvm advising patient to come into office at her next nurse appt in September to learn how to administer b12 injections, dr Jenny Reichmann has already sent b12 RX to her pharmacy, she can pick that up and bring in with her---can talk with tamara if any further questions

## 2018-08-12 ENCOUNTER — Other Ambulatory Visit: Payer: Self-pay

## 2018-08-12 ENCOUNTER — Ambulatory Visit (INDEPENDENT_AMBULATORY_CARE_PROVIDER_SITE_OTHER): Payer: BC Managed Care – PPO | Admitting: Internal Medicine

## 2018-08-12 ENCOUNTER — Ambulatory Visit: Payer: BC Managed Care – PPO | Admitting: Internal Medicine

## 2018-08-12 ENCOUNTER — Encounter: Payer: Self-pay | Admitting: Internal Medicine

## 2018-08-12 ENCOUNTER — Other Ambulatory Visit: Payer: BC Managed Care – PPO

## 2018-08-12 VITALS — BP 114/68 | HR 65 | Temp 98.1°F | Ht 59.0 in | Wt 112.0 lb

## 2018-08-12 DIAGNOSIS — S70361A Insect bite (nonvenomous), right thigh, initial encounter: Secondary | ICD-10-CM | POA: Diagnosis not present

## 2018-08-12 DIAGNOSIS — W57XXXA Bitten or stung by nonvenomous insect and other nonvenomous arthropods, initial encounter: Secondary | ICD-10-CM

## 2018-08-12 MED ORDER — DOXYCYCLINE HYCLATE 100 MG PO TABS: 100.0000 mg | ORAL_TABLET | Freq: Two times a day (BID) | ORAL | 0 refills | Status: AC

## 2018-08-12 NOTE — Patient Instructions (Signed)
Please take all new medication as prescribed - the antibiotic  Please continue all other medications as before, and refills have been done if requested.  Please have the pharmacy call with any other refills you may need.  Please keep your appointments with your specialists as you may have planned  Please go to the LAB in the Basement (turn left off the elevator) for the tests to be done today  You will be contacted by phone if any changes need to be made immediately.  Otherwise, you will receive a letter about your results with an explanation, but please check with MyChart first.  Please remember to sign up for MyChart if you have not done so, as this will be important to you in the future with finding out test results, communicating by private email, and scheduling acute appointments online when needed.   

## 2018-08-12 NOTE — Progress Notes (Signed)
Subjective:    Patient ID: Grace Calhoun, female    DOB: [birth year], 28 y.o.   MRN: [id number]  HPI  Here to f/u with c/o right mid anterior thigh insect tick bite site and she has removed the tick last wk, but concerned about possible lyme disease or similar.   Pt denies fever, wt loss, night sweats, loss of appetite, or other constitutional symptoms Denies myalgias and Pt denies new neurological symptoms such as new headache, or facial or extremity weakness or numbness   Pt denies polydipsia, polyuria Past Medical History:  Diagnosis Date  . Allergic rhinitis 11/13/2016  . Anxiety 11/13/2016  . Asthma   . B12 deficiency 07/15/2018  . Frequent headaches   . Heart murmur   . Migraines   . Ocular migraine   . UTI (urinary tract infection)   . White matter abnormality on MRI of brain    Past Surgical History:  Procedure Laterality Date  . lumbar puncture    . NO PAST SURGERIES      reports that she has never smoked. She has never used smokeless tobacco. She reports current alcohol use. She reports that she does not use drugs. family history includes Breast cancer in her maternal aunt; Heart disease in her paternal grandfather. Allergies  Allergen Reactions  . Naproxen     Other reaction(s): Dizziness   Current Outpatient Medications on File Prior to Visit  Medication Sig Dispense Refill  . acetaminophen (TYLENOL) 500 MG tablet Take 500-1,000 mg by mouth as needed.    . ALPRAZolam (XANAX) 0.25 MG tablet Take 1-2 tabs (0.25mg -0.50mg ) 30-60 minutes before procedure. May repeat if needed.Do not drive. 4 tablet 0  . cyanocobalamin (,VITAMIN B-12,) 1000 MCG/ML injection Inject 1 mL (1,000 mcg total) into the muscle every 30 (thirty) days. 3 mL 3  . Diclofenac Sodium (PENNSAID) 2 % SOLN Place 1 application onto the skin 2 (two) times daily. 1 Bottle 3  . ibuprofen (ADVIL,MOTRIN) 200 MG tablet Take 200 mg by mouth every 6 (six) hours as needed.    Marland Kitchen VALERIAN PO Take 1 Dose by mouth 3  (three) times a week. Takes as needed     Current Facility-Administered Medications on File Prior to Visit  Medication Dose Route Frequency Provider Last Rate Last Dose  . gadopentetate dimeglumine (MAGNEVIST) injection 10 mL  10 mL Intravenous Once PRN Melvenia Beam, MD       Review of Systems All otherwise neg per pt     Objective:   Physical Exam BP 114/68   Pulse 65   Temp 98.1 F (36.7 C) (Oral)   Ht 4\' 11"  (1.499 m)   Wt 112 lb (50.8 kg)   SpO2 99%   BMI 22.62 kg/m  VS noted, non toxic Constitutional: Pt appears in NAD HENT: Head: NCAT.  Right Ear: External ear normal.  Left Ear: External ear normal.  Eyes: . Pupils are equal, round, and reactive to light. Conjunctivae and EOM are normal Nose: without d/c or deformity Neck: Neck supple. Gross normal ROM Cardiovascular: Normal rate and regular rhythm.   Pulmonary/Chest: Effort normal and breath sounds without rales or wheezing.  Neurological: Pt is alert. At baseline orientation, motor grossly intact Skin: Skin is warm. no LE edema; right mid anterior thigh with tick bite site with mild erythema tender without other swelling or abscess, no red streaks Psychiatric: Pt behavior is normal without agitation  No other exam findings Lab Results  Component Value Date   WBC  WILL FOLLOW 07/08/2018   HGB WILL FOLLOW 07/08/2018   HCT WILL FOLLOW 07/08/2018   PLT WILL FOLLOW 07/08/2018   GLUCOSE 73 07/07/2018   ALT 8 07/07/2018   AST 14 07/07/2018   NA 138 07/07/2018   K 4.3 07/07/2018   CL 101 07/07/2018   CREATININE 0.74 07/07/2018   BUN 9 07/07/2018   CO2 23 07/07/2018   TSH 3.850 07/07/2018  '    Assessment & Plan:

## 2018-08-14 LAB — B. BURGDORFI ANTIBODIES BY WB
B burgdorferi IgG Abs (IB): NEGATIVE
B burgdorferi IgM Abs (IB): POSITIVE — AB
Lyme Disease 18 kD IgG: NONREACTIVE
Lyme Disease 23 kD IgG: NONREACTIVE
Lyme Disease 23 kD IgM: REACTIVE — AB
Lyme Disease 28 kD IgG: NONREACTIVE
Lyme Disease 30 kD IgG: NONREACTIVE
Lyme Disease 39 kD IgG: NONREACTIVE
Lyme Disease 39 kD IgM: REACTIVE — AB
Lyme Disease 41 kD IgG: REACTIVE — AB
Lyme Disease 41 kD IgM: NONREACTIVE
Lyme Disease 45 kD IgG: NONREACTIVE
Lyme Disease 58 kD IgG: NONREACTIVE
Lyme Disease 66 kD IgG: NONREACTIVE
Lyme Disease 93 kD IgG: NONREACTIVE

## 2018-08-15 ENCOUNTER — Encounter: Payer: Self-pay | Admitting: Internal Medicine

## 2018-08-15 DIAGNOSIS — Z0000 Encounter for general adult medical examination without abnormal findings: Secondary | ICD-10-CM

## 2018-08-15 DIAGNOSIS — E538 Deficiency of other specified B group vitamins: Secondary | ICD-10-CM

## 2018-08-15 DIAGNOSIS — E611 Iron deficiency: Secondary | ICD-10-CM

## 2018-08-15 DIAGNOSIS — E559 Vitamin D deficiency, unspecified: Secondary | ICD-10-CM

## 2018-08-15 DIAGNOSIS — W57XXXA Bitten or stung by nonvenomous insect and other nonvenomous arthropods, initial encounter: Secondary | ICD-10-CM | POA: Insufficient documentation

## 2018-08-15 NOTE — Telephone Encounter (Signed)
[facility] for staff to contact pt  - for cpx in the next 4 wks at pt convenience, with labs prior

## 2018-08-15 NOTE — Assessment & Plan Note (Signed)
With possible early infection including cant r/o lyme dz, for doxy course, also for Lyme Dz antibodies,  to f/u any worsening symptoms or concerns

## 2018-08-18 ENCOUNTER — Encounter: Payer: Self-pay | Admitting: Internal Medicine

## 2018-08-18 NOTE — Telephone Encounter (Signed)
Patient is scheduled for Sept 9th.  She would like to know if it would be possible for Dr. Jenny Reichmann to send another round of antibiotics to her pharmacy ([facility] on [address]) in case she is not feeling any better past the 10 days?

## 2018-09-04 ENCOUNTER — Other Ambulatory Visit (INDEPENDENT_AMBULATORY_CARE_PROVIDER_SITE_OTHER): Payer: BC Managed Care – PPO

## 2018-09-04 ENCOUNTER — Encounter: Payer: Self-pay | Admitting: Internal Medicine

## 2018-09-04 ENCOUNTER — Ambulatory Visit (INDEPENDENT_AMBULATORY_CARE_PROVIDER_SITE_OTHER): Payer: BC Managed Care – PPO

## 2018-09-04 ENCOUNTER — Other Ambulatory Visit: Payer: Self-pay

## 2018-09-04 DIAGNOSIS — E559 Vitamin D deficiency, unspecified: Secondary | ICD-10-CM

## 2018-09-04 DIAGNOSIS — E611 Iron deficiency: Secondary | ICD-10-CM | POA: Diagnosis not present

## 2018-09-04 DIAGNOSIS — Z0000 Encounter for general adult medical examination without abnormal findings: Secondary | ICD-10-CM

## 2018-09-04 DIAGNOSIS — E538 Deficiency of other specified B group vitamins: Secondary | ICD-10-CM | POA: Diagnosis not present

## 2018-09-04 LAB — URINALYSIS, ROUTINE W REFLEX MICROSCOPIC
Bilirubin Urine: NEGATIVE
Hgb urine dipstick: NEGATIVE
Leukocytes,Ua: NEGATIVE
Nitrite: NEGATIVE
RBC / HPF: NONE SEEN (ref 0–?)
Specific Gravity, Urine: 1.025 (ref 1.000–1.030)
Total Protein, Urine: NEGATIVE
Urine Glucose: NEGATIVE
Urobilinogen, UA: 0.2 (ref 0.0–1.0)
pH: 6 (ref 5.0–8.0)

## 2018-09-04 LAB — BASIC METABOLIC PANEL
BUN: 14 mg/dL (ref 6–23)
CO2: 27 mEq/L (ref 19–32)
Calcium: 9.8 mg/dL (ref 8.4–10.5)
Chloride: 104 mEq/L (ref 96–112)
Creatinine, Ser: 0.64 mg/dL (ref 0.40–1.20)
GFR: 111.81 mL/min (ref >60.00)
Glucose, Bld: 83 mg/dL (ref 70–99)
Potassium: 4.2 mEq/L (ref 3.5–5.1)
Sodium: 138 mEq/L (ref 135–145)

## 2018-09-04 LAB — CBC WITH DIFFERENTIAL/PLATELET
Basophils Absolute: 0 10*3/uL (ref 0.0–0.1)
Basophils Relative: 0.4 % (ref 0.0–3.0)
Eosinophils Absolute: 0.1 10*3/uL (ref 0.0–0.7)
Eosinophils Relative: 0.9 % (ref 0.0–5.0)
HCT: 42.4 % (ref 36.0–46.0)
Hemoglobin: 14.5 g/dL (ref 12.0–15.0)
Lymphocytes Relative: 19.2 % (ref 12.0–46.0)
Lymphs Abs: 1.5 10*3/uL (ref 0.7–4.0)
MCHC: 34.2 g/dL (ref 30.0–36.0)
MCV: 93.5 fl (ref 78.0–100.0)
Monocytes Absolute: 0.5 10*3/uL (ref 0.1–1.0)
Monocytes Relative: 5.8 % (ref 3.0–12.0)
Neutro Abs: 5.7 10*3/uL (ref 1.4–7.7)
Neutrophils Relative %: 73.7 % (ref 43.0–77.0)
Platelets: 243 10*3/uL (ref 150.0–400.0)
RBC: 4.53 Mil/uL (ref 3.87–5.11)
RDW: 12.2 % (ref 11.5–15.5)
WBC: 7.8 10*3/uL (ref 4.0–10.5)

## 2018-09-04 LAB — IBC PANEL
Iron: 133 ug/dL (ref 42–145)
Saturation Ratios: 38.5 % (ref 20.0–50.0)
Transferrin: 247 mg/dL (ref 212.0–360.0)

## 2018-09-04 LAB — VITAMIN D 25 HYDROXY (VIT D DEFICIENCY, FRACTURES): VITD: 35.37 ng/mL (ref 30.00–100.00)

## 2018-09-04 LAB — HEPATIC FUNCTION PANEL
ALT: 6 U/L (ref 0–35)
AST: 16 U/L (ref 0–37)
Albumin: 4.6 g/dL (ref 3.5–5.2)
Alkaline Phosphatase: 58 U/L (ref 39–117)
Bilirubin, Direct: 0.1 mg/dL (ref 0.0–0.3)
Total Bilirubin: 0.7 mg/dL (ref 0.2–1.2)
Total Protein: 7.2 g/dL (ref 6.0–8.3)

## 2018-09-04 LAB — LIPID PANEL
Cholesterol: 188 mg/dL (ref 0–200)
HDL: 82.5 mg/dL (ref >39.00)
LDL Cholesterol: 91 mg/dL (ref 0–99)
NonHDL: 105.7
Total CHOL/HDL Ratio: 2
Triglycerides: 73 mg/dL (ref 0.0–149.0)
VLDL: 14.6 mg/dL (ref 0.0–40.0)

## 2018-09-04 LAB — TSH: TSH: 1.89 u[IU]/mL (ref 0.35–4.50)

## 2018-09-04 MED ORDER — CYANOCOBALAMIN 1000 MCG/ML IJ SOLN
1000.0000 ug | Freq: Once | INTRAMUSCULAR | Status: AC
  Administered 2018-09-04 (×2): 1000 ug via INTRAMUSCULAR

## 2018-09-04 NOTE — Progress Notes (Signed)
Medical screening examination/treatment/procedure(s) were performed by non-physician practitioner and as supervising physician I was immediately available for consultation/collaboration. I agree with above. Fallynn Gravett, MD   

## 2018-09-05 ENCOUNTER — Ambulatory Visit: Payer: BC Managed Care – PPO

## 2018-09-09 ENCOUNTER — Encounter: Payer: Self-pay | Admitting: Internal Medicine

## 2018-09-10 ENCOUNTER — Encounter: Payer: Self-pay | Admitting: Internal Medicine

## 2018-09-10 ENCOUNTER — Ambulatory Visit (INDEPENDENT_AMBULATORY_CARE_PROVIDER_SITE_OTHER): Payer: BC Managed Care – PPO | Admitting: Internal Medicine

## 2018-09-10 DIAGNOSIS — Z0000 Encounter for general adult medical examination without abnormal findings: Secondary | ICD-10-CM

## 2018-09-10 DIAGNOSIS — E538 Deficiency of other specified B group vitamins: Secondary | ICD-10-CM

## 2018-09-10 DIAGNOSIS — W57XXXA Bitten or stung by nonvenomous insect and other nonvenomous arthropods, initial encounter: Secondary | ICD-10-CM

## 2018-09-10 NOTE — Assessment & Plan Note (Signed)

## 2018-09-10 NOTE — Assessment & Plan Note (Signed)
Cont monthly b12

## 2018-09-10 NOTE — Assessment & Plan Note (Signed)
With + serology for lyme dz - s/p doxy course, asympt,  to f/u any worsening symptoms or concerns

## 2018-09-10 NOTE — Progress Notes (Signed)
Patient ID: Grace Calhoun, female   DOB: [birth year], 28 y.o.   MRN: [id number]  Virtual Visit via Video Note  I connected with Grace Calhoun on 09/10/18 at  8:00 AM EDT by a video enabled telemedicine application and verified that I am speaking with the correct person using two identifiers.  Location: Patient: at home Provider: at office   I discussed the limitations of evaluation and management by telemedicine and the availability of in person appointments. The patient expressed understanding and agreed to proceed.  History of Present Illness: Here for wellness and f/u;  Overall doing ok;  Pt denies Chest pain, worsening SOB, DOE, wheezing, orthopnea, PND, worsening LE edema, palpitations, dizziness or syncope.  Pt denies neurological change such as new headache, facial or extremity weakness.  Pt denies polydipsia, polyuria, or low sugar symptoms. Pt states overall good compliance with treatment and medications, good tolerability, and has been trying to follow appropriate diet.  Pt denies worsening depressive symptoms, suicidal ideation or panic. No fever, night sweats, wt loss, loss of appetite, or other constitutional symptoms.  Pt states good ability with ADL's, has low fall risk, home safety reviewed and adequate, no other significant changes in hearing or vision, and only occasionally active with exercise.  Plans to have Tdap/Flu/pap and b12 shot in October (scheduled) so declines today Past Medical History:  Diagnosis Date  . Allergic rhinitis 11/13/2016  . Anxiety 11/13/2016  . Asthma   . B12 deficiency 07/15/2018  . Frequent headaches   . Heart murmur   . Migraines   . Ocular migraine   . UTI (urinary tract infection)   . White matter abnormality on MRI of brain    Past Surgical History:  Procedure Laterality Date  . lumbar puncture    . NO PAST SURGERIES      reports that she has never smoked. She has never used smokeless tobacco. She reports current alcohol use. She reports  that she does not use drugs. family history includes Breast cancer in her maternal aunt; Heart disease in her paternal grandfather. Allergies  Allergen Reactions  . Naproxen     Other reaction(s): Dizziness   Current Outpatient Medications on File Prior to Visit  Medication Sig Dispense Refill  . acetaminophen (TYLENOL) 500 MG tablet Take 500-1,000 mg by mouth as needed.    . ALPRAZolam (XANAX) 0.25 MG tablet Take 1-2 tabs (0.25mg -0.50mg ) 30-60 minutes before procedure. May repeat if needed.Do not drive. 4 tablet 0  . cyanocobalamin (,VITAMIN B-12,) 1000 MCG/ML injection Inject 1 mL (1,000 mcg total) into the muscle every 30 (thirty) days. 3 mL 3  . Diclofenac Sodium (PENNSAID) 2 % SOLN Place 1 application onto the skin 2 (two) times daily. 1 Bottle 3  . doxycycline (VIBRA-TABS) 100 MG tablet Take 1 tablet (100 mg total) by mouth 2 (two) times daily. 20 tablet 0  . ibuprofen (ADVIL,MOTRIN) 200 MG tablet Take 200 mg by mouth every 6 (six) hours as needed.    Marland Kitchen VALERIAN PO Take 1 Dose by mouth 3 (three) times a week. Takes as needed     Current Facility-Administered Medications on File Prior to Visit  Medication Dose Route Frequency Provider Last Rate Last Dose  . gadopentetate dimeglumine (MAGNEVIST) injection 10 mL  10 mL Intravenous Once PRN Melvenia Beam, MD        Observations/Objective: Alert, NAD, appropriate mood and affect, resps normal, cn 2-12 intact, moves all 4s, no visible rash or swelling Lab Results  Component Value Date  WBC 7.8 09/04/2018   HGB 14.5 09/04/2018   HCT 42.4 09/04/2018   PLT 243.0 09/04/2018   GLUCOSE 83 09/04/2018   CHOL 188 09/04/2018   TRIG 73.0 09/04/2018   HDL 82.50 09/04/2018   LDLCALC 91 09/04/2018   ALT 6 09/04/2018   AST 16 09/04/2018   NA 138 09/04/2018   K 4.2 09/04/2018   CL 104 09/04/2018   CREATININE 0.64 09/04/2018   BUN 14 09/04/2018   CO2 27 09/04/2018   TSH 1.89 09/04/2018   Assessment and Plan: See notes  Follow Up  Instructions: See notes   I discussed the assessment and treatment plan with the patient. The patient was provided an opportunity to ask questions and all were answered. The patient agreed with the plan and demonstrated an understanding of the instructions.   The patient was advised to call back or seek an in-person evaluation if the symptoms worsen or if the condition fails to improve as anticipated.   Cathlean Cower, MD

## 2018-09-10 NOTE — Patient Instructions (Signed)
Please continue all other medications as before, and refills have been done if requested.  Please have the pharmacy call with any other refills you may need.  Please continue your efforts at being more active, low cholesterol diet, and weight control.  You are otherwise up to date with prevention measures today.  Please keep your appointments with your specialists as you may have planned  Please return in 1 year for your yearly visit, or sooner if needed 

## 2018-10-06 ENCOUNTER — Ambulatory Visit (INDEPENDENT_AMBULATORY_CARE_PROVIDER_SITE_OTHER): Payer: BC Managed Care – PPO

## 2018-10-06 DIAGNOSIS — Z01419 Encounter for gynecological examination (general) (routine) without abnormal findings: Secondary | ICD-10-CM | POA: Diagnosis not present

## 2018-10-06 DIAGNOSIS — Z23 Encounter for immunization: Secondary | ICD-10-CM

## 2018-10-06 DIAGNOSIS — E538 Deficiency of other specified B group vitamins: Secondary | ICD-10-CM | POA: Diagnosis not present

## 2018-10-06 DIAGNOSIS — Z6822 Body mass index (BMI) 22.0-22.9, adult: Secondary | ICD-10-CM | POA: Diagnosis not present

## 2018-10-06 MED ORDER — CYANOCOBALAMIN 1000 MCG/ML IJ SOLN
1000.0000 ug | Freq: Once | INTRAMUSCULAR | Status: AC
  Administered 2018-10-06: 1000 ug via INTRAMUSCULAR

## 2018-10-06 NOTE — Progress Notes (Signed)
Medical screening examination/treatment/procedure(s) were performed by non-physician practitioner and as supervising physician I was immediately available for consultation/collaboration. I agree with above. James John, MD   

## 2018-10-10 ENCOUNTER — Encounter: Payer: Self-pay | Admitting: Internal Medicine

## 2018-10-14 ENCOUNTER — Other Ambulatory Visit (INDEPENDENT_AMBULATORY_CARE_PROVIDER_SITE_OTHER): Payer: BC Managed Care – PPO

## 2018-10-14 DIAGNOSIS — E538 Deficiency of other specified B group vitamins: Secondary | ICD-10-CM

## 2018-10-14 LAB — VITAMIN B12: Vitamin B-12: >1500 pg/mL — ABNORMAL HIGH (ref 211–911)

## 2018-10-21 NOTE — Telephone Encounter (Signed)
I spoke with Dr. Rexene Alberts, work-in physician and advised her of pt's message regarding the hand weakness. Per Dr. Rexene Alberts, recommend an OTC splint for hand and ask Dr. Jaynee Eagles to consider adding r/o carpal tunnel for the pending EMG.

## 2018-11-06 ENCOUNTER — Ambulatory Visit (INDEPENDENT_AMBULATORY_CARE_PROVIDER_SITE_OTHER): Payer: BC Managed Care – PPO

## 2018-11-06 ENCOUNTER — Other Ambulatory Visit: Payer: Self-pay

## 2018-11-06 DIAGNOSIS — E538 Deficiency of other specified B group vitamins: Secondary | ICD-10-CM

## 2018-11-06 MED ORDER — CYANOCOBALAMIN 1000 MCG/ML IJ SOLN
1000.0000 ug | Freq: Once | INTRAMUSCULAR | Status: AC
  Administered 2018-11-06: 1000 ug via INTRAMUSCULAR

## 2018-11-06 NOTE — Progress Notes (Signed)
Medical screening examination/treatment/procedure(s) were performed by non-physician practitioner and as supervising physician I was immediately available for consultation/collaboration. I agree with above. Kissy Cielo, MD   

## 2018-11-11 ENCOUNTER — Encounter: Payer: Self-pay | Admitting: Internal Medicine

## 2018-11-14 DIAGNOSIS — Z20828 Contact with and (suspected) exposure to other viral communicable diseases: Secondary | ICD-10-CM | POA: Diagnosis not present

## 2018-11-17 ENCOUNTER — Telehealth: Payer: Self-pay | Admitting: *Deleted

## 2018-11-17 NOTE — Telephone Encounter (Signed)
Pt called with a couple questions about EMG on Thursday. Her questions were answered.Pt aware she does not need a driver. I also advised her to make sure her skin is clean as well for the test. She verbalized understanding and appreciation.

## 2018-11-20 ENCOUNTER — Ambulatory Visit (INDEPENDENT_AMBULATORY_CARE_PROVIDER_SITE_OTHER): Payer: BC Managed Care – PPO | Admitting: Neurology

## 2018-11-20 ENCOUNTER — Other Ambulatory Visit: Payer: Self-pay

## 2018-11-20 ENCOUNTER — Ambulatory Visit: Payer: BC Managed Care – PPO | Admitting: Neurology

## 2018-11-20 DIAGNOSIS — R202 Paresthesia of skin: Secondary | ICD-10-CM

## 2018-11-20 DIAGNOSIS — R200 Anesthesia of skin: Secondary | ICD-10-CM | POA: Diagnosis not present

## 2018-11-20 DIAGNOSIS — G35 Multiple sclerosis: Secondary | ICD-10-CM

## 2018-11-20 DIAGNOSIS — Z0289 Encounter for other administrative examinations: Secondary | ICD-10-CM

## 2018-11-20 NOTE — Progress Notes (Signed)
Full Name: Grace Calhoun Gender: Female MRN #: [id number] Date of Birth: November 14, 1990    Visit Date: 11/20/2018 08:44 Age: 28 Years 60 Months Old Examining Physician: Sarina Ill, MD  Referring Physician: Sarina Ill, MD  History: Patient with demyelinating cerebellar lesion, have been serially testing for progression to MS. Developed paresthesias. EMG/NCS to evaluate for peripheral causes.    Summary: EMG/NCS performed on the right upper and lower extremities. All nerves and muscles (as indicated in the following tables) were within normal limits.     Conclusion: This is a normal study.    Sarina Ill M.D.  [facility] [place], [place] [address] Tel: [phone] Fax: [phone]        [facility]    Nerve / Sites Muscle Latency Ref. Amplitude Ref. Rel Amp Segments Distance Velocity Ref. Area    ms ms mV mV %  cm m/s m/s mVms  R Median - APB     Wrist APB 3.3 ?4.4 7.4 ?4.0 100 Wrist - APB 7   31.6     Upper arm APB 6.6  7.4  101 Upper arm - Wrist 21 64 ?49 31.7  R Ulnar - ADM     Wrist ADM 2.9 ?3.3 15.2 ?6.0 100 Wrist - ADM 7   53.6     B.Elbow ADM 5.5  15.3  101 B.Elbow - Wrist 19 73 ?49 54.9     A.Elbow ADM 6.9  15.2  99.1 A.Elbow - B.Elbow 10 71 ?49 54.7         A.Elbow - Wrist      R Peroneal - EDB     Ankle EDB 4.2 ?6.5 7.5 ?2.0 100 Ankle - EDB 9   32.8     Fib head EDB 8.4  7.5  99.9 Fib head - Ankle 25 60 ?44 31.8     Pop fossa EDB 9.7  7.4  98.2 Pop fossa - Fib head 8 61 ?44 33.0         Pop fossa - Ankle      R Tibial - AH     Ankle AH 3.6 ?5.8 25.5 ?4.0 100 Ankle - AH 9   59.1     Pop fossa AH 9.7  23.1  90.4 Pop fossa - Ankle 31 51 ?41 57.8             SNC    Nerve / Sites Rec. Site Peak Lat Ref.  Amp Ref. Segments Distance Peak Diff Ref.    ms ms V V  cm ms ms  R Sural - Ankle (Calf)     Calf Ankle 2.9 ?4.4 26 ?6 Calf - Ankle 14    R Superficial peroneal - Ankle     Lat leg Ankle 3.4 ?4.4 12 ?6 Lat leg - Ankle 14     R Median, Ulnar - Transcarpal comparison     Median Palm Wrist 1.9 ?2.2 138 ?35 Median Palm - Wrist 8       Ulnar Palm Wrist 1.9 ?2.2 46 ?12 Ulnar Palm - Wrist 8          Median Palm - Ulnar Palm  0.0 ?0.4  R Median - Orthodromic (Dig II, Mid palm)     Dig II Wrist 2.9 ?3.4 35 ?10 Dig II - Wrist 13    R Ulnar - Orthodromic, (Dig V, Mid palm)     Dig V Wrist 2.8 ?3.1 12 ?5 Dig V -  Wrist 66                 F  Wave    Nerve F Lat Ref.   ms ms  R Tibial - AH 39.3 ?56.0  R Ulnar - ADM 25.5 ?32.0         EMG full       EMG Summary Table    Spontaneous MUAP Recruitment  Muscle IA Fib PSW Fasc Other Amp Dur. Poly Pattern  R. Deltoid Normal None None None _______ Normal Normal Normal Normal  R. Triceps brachii Normal None None None _______ Normal Normal Normal Normal  R. Pronator teres Normal None None None _______ Normal Normal Normal Normal  R. First dorsal interosseous Normal None None None _______ Normal Normal Normal Normal  R. Opponens pollicis Normal None None None _______ Normal Normal Normal Normal  R. Vastus medialis Normal None None None _______ Normal Normal Normal Normal  R. Tibialis anterior Normal None None None _______ Normal Normal Normal Normal  R. Gastrocnemius (Medial head) Normal None None None _______ Normal Normal Normal Normal  R. Extensor hallucis longus Normal None None None _______ Normal Normal Normal Normal  R. Abductor hallucis Normal None None None _______ Normal Normal Normal Normal

## 2018-11-20 NOTE — Progress Notes (Signed)
See procedure note.

## 2018-11-21 DIAGNOSIS — R350 Frequency of micturition: Secondary | ICD-10-CM | POA: Diagnosis not present

## 2018-11-22 NOTE — Procedures (Signed)
Full Name: Grace Calhoun Gender: Female MRN #: [id number] Date of Birth: November 14, 1990    Visit Date: 11/20/2018 08:44 Age: 28 Years 60 Months Old Examining Physician: Sarina Ill, MD  Referring Physician: Sarina Ill, MD  History: Patient with demyelinating cerebellar lesion, have been serially testing for progression to MS. Developed paresthesias. EMG/NCS to evaluate for peripheral causes.    Summary: EMG/NCS performed on the right upper and lower extremities. All nerves and muscles (as indicated in the following tables) were within normal limits.     Conclusion: This is a normal study.    Sarina Ill M.D.  [facility] [place], [place] [address] Tel: [phone] Fax: [phone]        [facility]    Nerve / Sites Muscle Latency Ref. Amplitude Ref. Rel Amp Segments Distance Velocity Ref. Area    ms ms mV mV %  cm m/s m/s mVms  R Median - APB     Wrist APB 3.3 ?4.4 7.4 ?4.0 100 Wrist - APB 7   31.6     Upper arm APB 6.6  7.4  101 Upper arm - Wrist 21 64 ?49 31.7  R Ulnar - ADM     Wrist ADM 2.9 ?3.3 15.2 ?6.0 100 Wrist - ADM 7   53.6     B.Elbow ADM 5.5  15.3  101 B.Elbow - Wrist 19 73 ?49 54.9     A.Elbow ADM 6.9  15.2  99.1 A.Elbow - B.Elbow 10 71 ?49 54.7         A.Elbow - Wrist      R Peroneal - EDB     Ankle EDB 4.2 ?6.5 7.5 ?2.0 100 Ankle - EDB 9   32.8     Fib head EDB 8.4  7.5  99.9 Fib head - Ankle 25 60 ?44 31.8     Pop fossa EDB 9.7  7.4  98.2 Pop fossa - Fib head 8 61 ?44 33.0         Pop fossa - Ankle      R Tibial - AH     Ankle AH 3.6 ?5.8 25.5 ?4.0 100 Ankle - AH 9   59.1     Pop fossa AH 9.7  23.1  90.4 Pop fossa - Ankle 31 51 ?41 57.8             SNC    Nerve / Sites Rec. Site Peak Lat Ref.  Amp Ref. Segments Distance Peak Diff Ref.    ms ms V V  cm ms ms  R Sural - Ankle (Calf)     Calf Ankle 2.9 ?4.4 26 ?6 Calf - Ankle 14    R Superficial peroneal - Ankle     Lat leg Ankle 3.4 ?4.4 12 ?6 Lat leg - Ankle 14     R Median, Ulnar - Transcarpal comparison     Median Palm Wrist 1.9 ?2.2 138 ?35 Median Palm - Wrist 8       Ulnar Palm Wrist 1.9 ?2.2 46 ?12 Ulnar Palm - Wrist 8          Median Palm - Ulnar Palm  0.0 ?0.4  R Median - Orthodromic (Dig II, Mid palm)     Dig II Wrist 2.9 ?3.4 35 ?10 Dig II - Wrist 13    R Ulnar - Orthodromic, (Dig V, Mid palm)     Dig V Wrist 2.8 ?3.1 12 ?5 Dig V -  Wrist 66                 F  Wave    Nerve F Lat Ref.   ms ms  R Tibial - AH 39.3 ?56.0  R Ulnar - ADM 25.5 ?32.0         EMG full       EMG Summary Table    Spontaneous MUAP Recruitment  Muscle IA Fib PSW Fasc Other Amp Dur. Poly Pattern  R. Deltoid Normal None None None _______ Normal Normal Normal Normal  R. Triceps brachii Normal None None None _______ Normal Normal Normal Normal  R. Pronator teres Normal None None None _______ Normal Normal Normal Normal  R. First dorsal interosseous Normal None None None _______ Normal Normal Normal Normal  R. Opponens pollicis Normal None None None _______ Normal Normal Normal Normal  R. Vastus medialis Normal None None None _______ Normal Normal Normal Normal  R. Tibialis anterior Normal None None None _______ Normal Normal Normal Normal  R. Gastrocnemius (Medial head) Normal None None None _______ Normal Normal Normal Normal  R. Extensor hallucis longus Normal None None None _______ Normal Normal Normal Normal  R. Abductor hallucis Normal None None None _______ Normal Normal Normal Normal

## 2018-11-22 NOTE — Progress Notes (Signed)
History: Patient with demyelinating cerebellar lesion, have been serially testing for progression to MS. Developed paresthesias. EMG/NCS to evaluate for peripheral causes was negative. Reviewed with patient, no peripheral causes found. She had several weeks of progressive symptoms paresthesias then slowly improved . Lesion in the brain we have been folowing supcious for demyelination. Repeat MRi brain and cervical spine for new MS lesion and lumbar puncture MS protocol.discussed vitamin D daily.   Orders Placed This Encounter  Procedures  . MR BRAIN W WO CONTRAST  . MR CERVICAL SPINE W WO CONTRAST  . DG FLUORO GUIDED LOC OF NEEDLE/CATH TIP FOR SPINAL INJECT LT     A total of 15 minutes was spent face-to-face with this patient. Over half this time was spent on counseling patient on the  1. Numbness and tingling   2. Multiple sclerosis ([facility])    diagnosis and different diagnostic and therapeutic options, counseling and coordination of care, risks ans benefits of management, compliance, or risk factor reduction and education.  This does not include time spent on emg/ncs procedure.

## 2018-11-23 ENCOUNTER — Encounter: Payer: Self-pay | Admitting: Internal Medicine

## 2018-11-24 ENCOUNTER — Other Ambulatory Visit: Payer: Self-pay | Admitting: Neurology

## 2018-12-01 ENCOUNTER — Other Ambulatory Visit: Payer: Self-pay | Admitting: Neurology

## 2018-12-01 MED ORDER — ALPRAZOLAM 0.25 MG PO TABS: ORAL_TABLET | ORAL | 0 refills | Status: AC

## 2018-12-01 NOTE — Telephone Encounter (Signed)
Sending request for anti-anxiety medication to Dr. Jaynee Eagles.

## 2018-12-04 ENCOUNTER — Ambulatory Visit (INDEPENDENT_AMBULATORY_CARE_PROVIDER_SITE_OTHER): Payer: BC Managed Care – PPO

## 2018-12-04 ENCOUNTER — Other Ambulatory Visit: Payer: Self-pay

## 2018-12-04 DIAGNOSIS — E538 Deficiency of other specified B group vitamins: Secondary | ICD-10-CM | POA: Diagnosis not present

## 2018-12-04 MED ORDER — CYANOCOBALAMIN 1000 MCG/ML IJ SOLN
1000.0000 ug | Freq: Once | INTRAMUSCULAR | Status: AC
  Administered 2018-12-04: 1000 ug via INTRAMUSCULAR

## 2018-12-04 NOTE — Progress Notes (Signed)
Medical screening examination/treatment/procedure(s) were performed by non-physician practitioner and as supervising physician I was immediately available for consultation/collaboration. I agree with above. Courtney Fenlon, MD   

## 2018-12-19 ENCOUNTER — Ambulatory Visit
Admission: RE | Admit: 2018-12-19 | Discharge: 2018-12-19 | Disposition: A | Discharge status: Home / Self Care | Payer: BC Managed Care – PPO | Source: Ambulatory Visit | Attending: Neurology | Admitting: Neurology

## 2018-12-19 ENCOUNTER — Other Ambulatory Visit: Payer: Self-pay

## 2018-12-19 DIAGNOSIS — R200 Anesthesia of skin: Secondary | ICD-10-CM

## 2018-12-19 DIAGNOSIS — R202 Paresthesia of skin: Secondary | ICD-10-CM

## 2018-12-19 DIAGNOSIS — G35 Multiple sclerosis: Secondary | ICD-10-CM

## 2018-12-19 MED ORDER — GADOBUTROL 1 MMOL/ML IV SOLN
6.0000 mL | Freq: Once | INTRAVENOUS | Status: AC | PRN
  Administered 2018-12-19: 6 mL via INTRAVENOUS

## 2018-12-22 ENCOUNTER — Other Ambulatory Visit: Payer: Self-pay | Admitting: Neurology

## 2018-12-22 ENCOUNTER — Ambulatory Visit
Admission: RE | Admit: 2018-12-22 | Discharge: 2018-12-22 | Disposition: A | Discharge status: Home / Self Care | Payer: BC Managed Care – PPO | Source: Ambulatory Visit | Attending: Neurology | Admitting: Neurology

## 2018-12-22 ENCOUNTER — Other Ambulatory Visit: Payer: Self-pay

## 2018-12-22 ENCOUNTER — Other Ambulatory Visit (HOSPITAL_COMMUNITY)
Admission: RE | Admit: 2018-12-22 | Discharge: 2018-12-22 | Disposition: A | Discharge status: Home / Self Care | Payer: BC Managed Care – PPO | Source: Ambulatory Visit | Attending: Neurology | Admitting: Neurology

## 2018-12-22 ENCOUNTER — Encounter: Payer: Self-pay | Admitting: *Deleted

## 2018-12-22 VITALS — BP 100/67 | HR 69

## 2018-12-22 DIAGNOSIS — R200 Anesthesia of skin: Secondary | ICD-10-CM | POA: Diagnosis not present

## 2018-12-22 DIAGNOSIS — G35 Multiple sclerosis: Secondary | ICD-10-CM | POA: Diagnosis not present

## 2018-12-22 DIAGNOSIS — R42 Dizziness and giddiness: Secondary | ICD-10-CM

## 2018-12-22 DIAGNOSIS — R9082 White matter disease, unspecified: Secondary | ICD-10-CM | POA: Diagnosis not present

## 2018-12-22 DIAGNOSIS — R202 Paresthesia of skin: Secondary | ICD-10-CM | POA: Diagnosis not present

## 2018-12-22 DIAGNOSIS — G43109 Migraine with aura, not intractable, without status migrainosus: Secondary | ICD-10-CM

## 2018-12-22 DIAGNOSIS — R836 Abnormal cytological findings in cerebrospinal fluid: Secondary | ICD-10-CM | POA: Diagnosis not present

## 2018-12-22 NOTE — Discharge Instructions (Signed)
Information About Deep Brain Stimulation What is deep brain stimulation? Deep brain stimulation (DBS) is a treatment in which electrical currents are delivered to certain areas of the brain. DBS may be used to treat Parkinson's disease, dystonia, essential tremor, epilepsy, and other mental health or neurological conditions. It can help manage symptoms such as:  Shaking or movements that you cannot control (tremors).  Stiffness or stiff movement (rigidity).  Slowed movement.  Walking problems.  Obsessive or compulsive behaviors.  Seizures. For this treatment, surgery is done to insert a wire (lead) and electrode into one or both sides of the brain to deliver the electrical currents. Before the procedure, you will have an MRI or a CT scan so your surgeon can pinpoint the exact areas of the brain that are responsible for your symptoms. After the wire is inserted, it is attached to a power source (neurostimulator or pulse generator) that is implanted under your skin near the collarbone, chest, or abdomen. Once the device is implanted, it can be used 24 hours a day. How does the DBS device work? The DBS device sends continuous electrical pulses. These pulses block the brain impulses that are causing your symptoms. The neurostimulator controls the pulses. The intensity and frequency of the pulses are programmed for each person and can be adjusted. What are the benefits? The benefits of DBS include:  Taking lower doses of medicines. This reduces the risk of side effects from medicines.  Helping to ease the disabling symptoms of Parkinson's disease and other movement disorders.  Treating both sides of the brain if needed. This means that DBS can help control symptoms that affect both the right and left sides of your body.  Providing continuous control of symptoms, 24 hours a day.  Adjusting treatment over time in order to improve effectiveness or reduce the risk of side  effects.  Providing a treatment that is usually well tolerated. DBS does not damage healthy brain tissue near the electrodes and does not destroy nerve cells. People who have DBS may still be candidates for other treatments, including stem cell or gene therapy. What are the risks? You will need to have brain surgery to place the DBS leads and the neurostimulator. Generally, this is a safe procedure, but the surgery has some risks. The main ones include:  A slight risk of bleeding in the brain (stroke). Bleeding might not cause problems. However, it could cause serious side effects, such as paralysis, vision problems, or trouble speaking.  A small risk of cerebrospinal fluid leak. This can cause headaches or meningitis.  Allergic reaction or infection. If this occurs, the device may need to be removed. Other risks associated with DBS include:  Temporary tingling in the face or limbs.  Confusion or trouble concentrating.  Shocking sensations.  Problems with coordination.  Mood swings or depression.  Dizziness or loss of balance.  Light-headedness.  Trouble sleeping.  Seizures.  The electrode being put in the wrong place or moving from the site where it was implanted.  The wire that connects the electrode to the neurostimulator becoming damaged.  Temporary worsening of tremor or other symptoms when stimulation is stopped.  Failure of treatment to help symptoms. What do I need to do to take care of the device? Your health care provider will program your device and teach you how to use it. This will usually be done at follow-up visits that start a few weeks after your surgery. When you have a DBS device, you should do  the following:  Continue to see your health care provider as long as you have your device. The programming of the device may need to be changed over time. Also, the battery in the device must be changed every 2-5 years.  Let health care providers know about the  device before you have an MRI.  Let health care providers know about the DBS device before you have any other medical devices implanted, such as a pacemaker.  Be aware that the neurostimulator may be turned off accidentally by certain devices. This is not harmful, but it could reduce symptom control. Devices that could turn it off include: ? Some theft detection devices in stores. ? Security devices at airports and other [employer]. ? Common household magnets and those found in radios and cell phones.  Get an ID card and a medical ID bracelet to let people know that you have an implanted medical device. Your DBS device may set off metal detectors such as those used at airports and stadiums. Contact a health care provider if:  Your symptoms get worse.  Your device is no longer working.  Your incision feels warm to the touch.  You have: ? Redness, swelling, or pain around your incision. ? Fluid or blood coming from your incision. ? Pus or a bad smell coming from the incision area. ? A fever. ? Confusion, mood changes, or trouble sleeping. ? Dizziness. ? Tingling, numbness, shocking sensations, or uncontrolled movements. ? Symptoms of depression, such as:  Prolonged sadness.  Loss of interest in daily activities.  Sudden changes in eating or sleeping habits.  Loss of appetite or sex drive.  Feeling irritable or anxious. Get help right away if:  You have a sudden onset of any of these symptoms: ? Headache. ? Confusion. ? Nausea and vomiting.  You have a seizure.  You have any symptoms of a stroke. "BE FAST" is an easy way to remember the main warning signs of a stroke: ? B - Balance. Signs are dizziness, sudden trouble walking, or loss of balance. ? E - Eyes. Signs are trouble seeing or a sudden change in vision. ? F - Face. Signs are sudden weakness or numbness of the face, or the face or eyelid drooping on one side. ? A - Arms. Signs are  weakness or numbness in an arm. This happens suddenly and usually on one side of the body. ? S - Speech. Signs are sudden trouble speaking, slurred speech, or trouble understanding what people say. ? T - Time. Time to call emergency services. Write down what time symptoms started.  You have serious thoughts about hurting yourself or others. These symptoms may represent a serious problem that is an emergency. Do not wait to see if the symptoms will go away. Get medical help right away. Call your local emergency services (911 in the [place] Do not drive yourself to the hospital. Summary  Deep brain stimulation may be used to treat many conditions, including tremors, Parkinson's disease, dystonia, epilepsy, and other mental health or neurological conditions.  DBS implantation involves two surgeries. First, the lead is placed into the affected area of the brain. Then, a second surgery is done to implant the neurostimulator in the chest and connect it to the lead.  The DBS will need to be programmed and may require several follow-up visits for adjustments in order to make it more effective. This information is not intended to replace advice given to you by your health care provider.  Make sure you discuss any questions you have with your health care provider. Document Released: 01/14/2015 Document Revised: 09/27/2017 Document Reviewed: 09/30/2017 Elsevier Patient Education  [place].     Lumbar Puncture Discharge Instructions  1. Go home and rest quietly for the next 24 hours.  It is important to lie flat for the next 24 hours.  Get up only to go to the restroom.  You may lie in the bed or on a couch on your back, your stomach, your left side or your right side.  You may have one pillow under your head.  You may have pillows between your knees while you are on your side or under your knees while you are on your back.  2. DO NOT drive today.  Recline the seat as far back as it will go, while  still wearing your seat belt, on the way home.  3. You may get up to go to the bathroom as needed.  You may sit up for 10 minutes to eat.  You may resume your normal diet and medications unless otherwise indicated.  Drink lots of extra fluids today and tomorrow.  4. The incidence of headache, nausea, or vomiting is about 5% (one in 20 patients).  If you develop a headache, lie flat and drink plenty of fluids until the headache goes away.  Caffeinated beverages may be helpful.  If you develop severe nausea and vomiting or a headache that does not go away with flat bed rest, call the physician who sent you here.   5. You may resume normal activities after your 24 hours of bed rest is over; however, do not exert yourself strongly or do any heavy lifting tomorrow.  6. Call your physician for a follow-up appointment.   7. If you have any questions  after you arrive home, please call [phone].  Discharge instructions have been explained to the patient.  The patient, or the person responsible for the patient, fully understands these instructions.

## 2018-12-23 LAB — CYTOLOGY - NON PAP

## 2018-12-24 ENCOUNTER — Telehealth: Payer: Self-pay | Admitting: *Deleted

## 2018-12-24 DIAGNOSIS — F4323 Adjustment disorder with mixed anxiety and depressed mood: Secondary | ICD-10-CM | POA: Diagnosis not present

## 2018-12-24 NOTE — Telephone Encounter (Signed)
LVM informing patient that her CSF cytology lab result is okay, and several labs are pending. Advised she'll get a call when those results are ready. Left # for questions.

## 2018-12-27 DIAGNOSIS — R300 Dysuria: Secondary | ICD-10-CM | POA: Diagnosis not present

## 2018-12-27 DIAGNOSIS — R350 Frequency of micturition: Secondary | ICD-10-CM | POA: Diagnosis not present

## 2018-12-30 NOTE — Telephone Encounter (Signed)
I spoke with Dr. Jaynee Eagles. The diagnosis was on there as something she is working the pt up for but has not formally diagnosed. We are monitoring for MS but the patient does not fit the clinical criteria for it right now. That's why we are doing the lumbar puncture and repeating the MRIs. She will take the diagnosis off the pt's list.

## 2018-12-31 ENCOUNTER — Encounter: Payer: Self-pay | Admitting: *Deleted

## 2019-01-01 LAB — TIQ-MISC

## 2019-01-01 LAB — HOUSE ACCOUNT TRACKING

## 2019-01-01 LAB — OLIGOCLONAL BANDS, CSF + SERM

## 2019-01-06 ENCOUNTER — Telehealth: Payer: Self-pay

## 2019-01-06 DIAGNOSIS — E538 Deficiency of other specified B group vitamins: Secondary | ICD-10-CM

## 2019-01-06 DIAGNOSIS — F4323 Adjustment disorder with mixed anxiety and depressed mood: Secondary | ICD-10-CM | POA: Diagnosis not present

## 2019-01-06 NOTE — Telephone Encounter (Signed)
error 

## 2019-01-07 ENCOUNTER — Ambulatory Visit: Payer: BC Managed Care – PPO

## 2019-01-08 ENCOUNTER — Other Ambulatory Visit: Payer: Self-pay

## 2019-01-08 ENCOUNTER — Ambulatory Visit (INDEPENDENT_AMBULATORY_CARE_PROVIDER_SITE_OTHER): Payer: BLUE CROSS/BLUE SHIELD

## 2019-01-08 DIAGNOSIS — E538 Deficiency of other specified B group vitamins: Secondary | ICD-10-CM

## 2019-01-08 LAB — VITAMIN B12: Vitamin B-12: >1500 pg/mL — ABNORMAL HIGH (ref 211–911)

## 2019-01-08 MED ORDER — CYANOCOBALAMIN 1000 MCG/ML IJ SOLN
1000.0000 ug | Freq: Once | INTRAMUSCULAR | Status: AC
  Administered 2019-01-08: 09:00:00 1000 ug via INTRAMUSCULAR

## 2019-01-08 NOTE — Progress Notes (Signed)
Medical screening examination/treatment/procedure(s) were performed by non-physician practitioner and as supervising physician I was immediately available for consultation/collaboration. I agree with above. Evany Schecter, MD   

## 2019-01-12 ENCOUNTER — Encounter: Payer: Self-pay | Admitting: *Deleted

## 2019-01-13 DIAGNOSIS — F4323 Adjustment disorder with mixed anxiety and depressed mood: Secondary | ICD-10-CM | POA: Diagnosis not present

## 2019-01-17 ENCOUNTER — Ambulatory Visit
Admission: RE | Admit: 2019-01-17 | Discharge: 2019-01-17 | Disposition: A | Discharge status: Home / Self Care | Payer: BC Managed Care – PPO | Source: Ambulatory Visit | Attending: Neurology | Admitting: Neurology

## 2019-01-17 ENCOUNTER — Other Ambulatory Visit: Payer: Self-pay

## 2019-01-17 DIAGNOSIS — R200 Anesthesia of skin: Secondary | ICD-10-CM | POA: Diagnosis not present

## 2019-01-17 DIAGNOSIS — G35 Multiple sclerosis: Secondary | ICD-10-CM | POA: Diagnosis not present

## 2019-01-17 MED ORDER — GADOBUTROL 1 MMOL/ML IV SOLN
6.0000 mL | Freq: Once | INTRAVENOUS | Status: AC | PRN
  Administered 2019-01-17: 6 mL via INTRAVENOUS

## 2019-01-22 DIAGNOSIS — F4323 Adjustment disorder with mixed anxiety and depressed mood: Secondary | ICD-10-CM | POA: Diagnosis not present

## 2019-02-06 ENCOUNTER — Ambulatory Visit (INDEPENDENT_AMBULATORY_CARE_PROVIDER_SITE_OTHER): Payer: BLUE CROSS/BLUE SHIELD | Admitting: *Deleted

## 2019-02-06 ENCOUNTER — Other Ambulatory Visit: Payer: Self-pay

## 2019-02-06 DIAGNOSIS — E538 Deficiency of other specified B group vitamins: Secondary | ICD-10-CM

## 2019-02-06 MED ORDER — CYANOCOBALAMIN 1000 MCG/ML IJ SOLN
1000.0000 ug | Freq: Once | INTRAMUSCULAR | Status: AC
  Administered 2019-02-06: 09:00:00 1000 ug via INTRAMUSCULAR

## 2019-02-06 NOTE — Progress Notes (Signed)
Medical screening examination/treatment/procedure(s) were performed by non-physician practitioner and as supervising physician I was immediately available for consultation/collaboration. I agree with above. Erianna Jolly, MD   

## 2019-03-06 ENCOUNTER — Ambulatory Visit (INDEPENDENT_AMBULATORY_CARE_PROVIDER_SITE_OTHER): Payer: BLUE CROSS/BLUE SHIELD | Admitting: *Deleted

## 2019-03-06 ENCOUNTER — Other Ambulatory Visit: Payer: Self-pay

## 2019-03-06 DIAGNOSIS — E538 Deficiency of other specified B group vitamins: Secondary | ICD-10-CM

## 2019-03-06 MED ORDER — CYANOCOBALAMIN 1000 MCG/ML IJ SOLN
1000.0000 ug | Freq: Once | INTRAMUSCULAR | Status: AC
  Administered 2019-03-06: 1000 ug via INTRAMUSCULAR

## 2019-03-06 NOTE — Progress Notes (Signed)
Pls cosign for B12 inj../lmb  

## 2019-04-09 ENCOUNTER — Other Ambulatory Visit: Payer: Self-pay

## 2019-04-09 ENCOUNTER — Ambulatory Visit (INDEPENDENT_AMBULATORY_CARE_PROVIDER_SITE_OTHER): Payer: BLUE CROSS/BLUE SHIELD | Admitting: *Deleted

## 2019-04-09 DIAGNOSIS — E538 Deficiency of other specified B group vitamins: Secondary | ICD-10-CM

## 2019-04-09 MED ORDER — CYANOCOBALAMIN 1000 MCG/ML IJ SOLN
1000.0000 ug | Freq: Once | INTRAMUSCULAR | Status: AC
  Administered 2019-04-09: 1000 ug via INTRAMUSCULAR

## 2019-04-09 NOTE — Progress Notes (Signed)
Pls cosign for B12 inj../lmb  

## 2019-04-10 ENCOUNTER — Ambulatory Visit: Payer: BLUE CROSS/BLUE SHIELD

## 2019-04-27 IMAGING — US ULTRASOUND LEFT BREAST LIMITED
1 series · 3 of 3 positions shown · non-contrast
Comparison: Previous exam(s).

CLINICAL DATA: Palpable lump in the left breast.

EXAM:
ULTRASOUND OF THE LEFT BREAST

[Series 1: ultrasound left breast limited · 0.06mm/px · 3 of 3 slices shown]
[im 1/3]
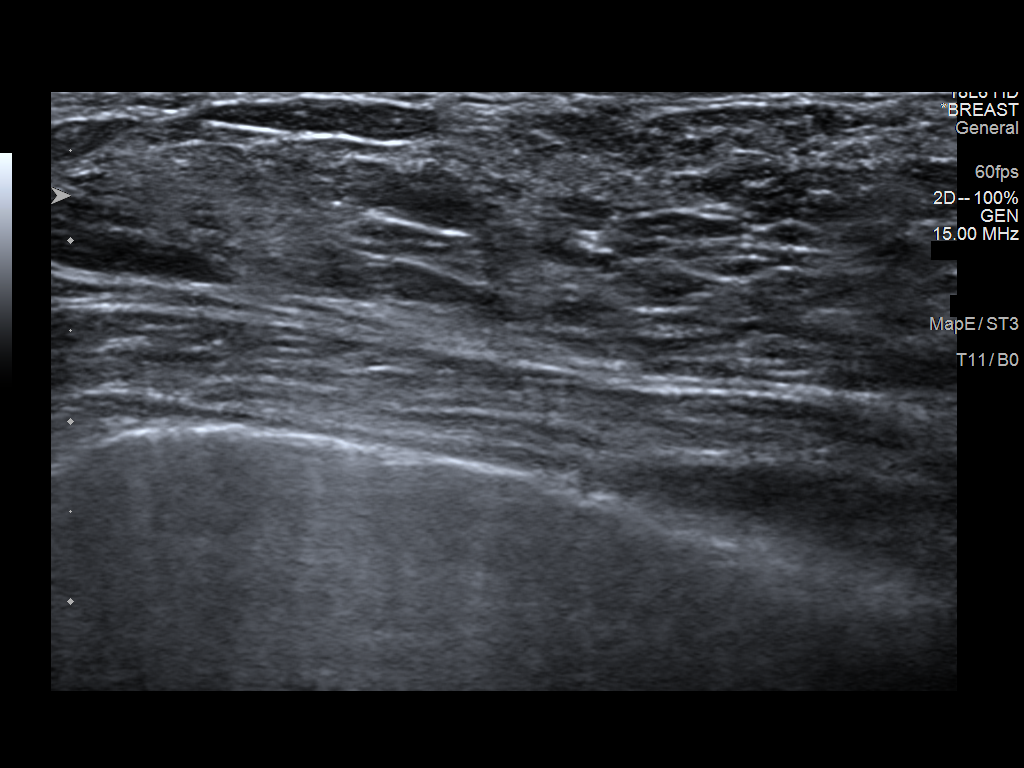
[im 2/3]
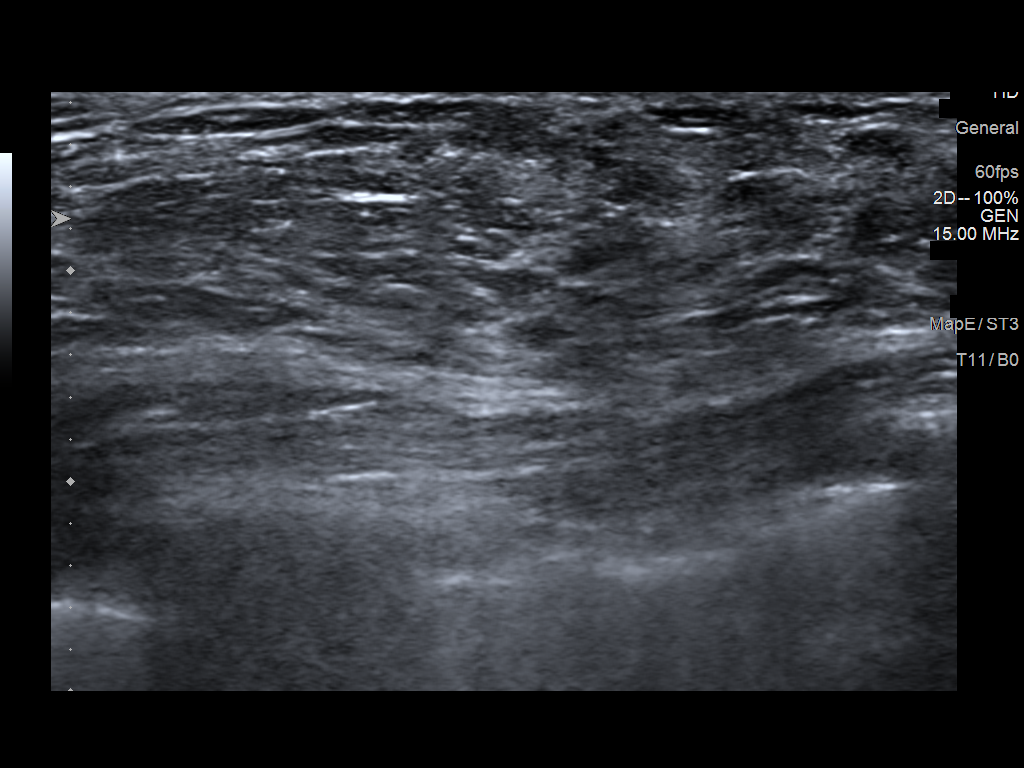
[im 3/3]
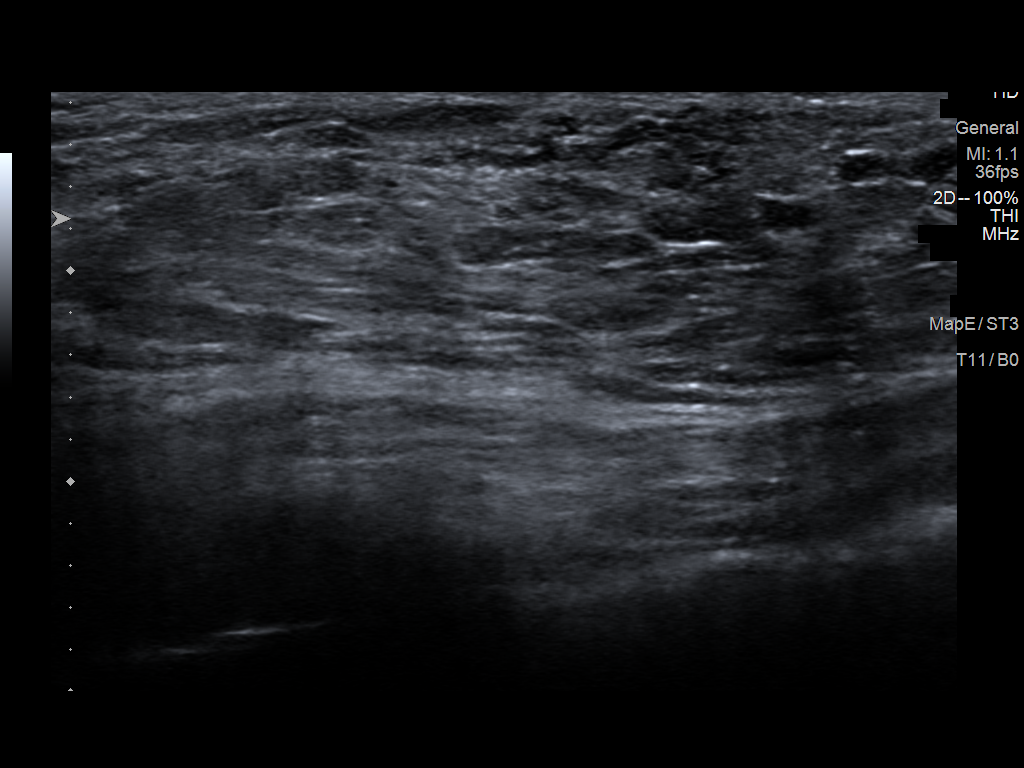

[3 of 3 positions shown; findings below may reference images not displayed]

FINDINGS: On physical exam, no suspicious lumps are identified.

Targeted ultrasound is performed, showing dense glandular tissue. No
suspicious masses.
IMPRESSION: No sonographic evidence of malignancy.

RECOMMENDATION:
Treatment of the patient's lump should be based on clinical and
physical exam given lack of imaging findings. Recommend annual
screening mammography beginning at the age of 40.

I have discussed the findings and recommendations with the patient.
Results were also provided in writing at the conclusion of the
visit. If applicable, a reminder letter will be sent to the patient
regarding the next appointment.

BI-RADS CATEGORY  1: Negative.

## 2019-05-08 ENCOUNTER — Other Ambulatory Visit: Payer: Self-pay

## 2019-05-08 ENCOUNTER — Ambulatory Visit (INDEPENDENT_AMBULATORY_CARE_PROVIDER_SITE_OTHER): Payer: BLUE CROSS/BLUE SHIELD | Admitting: *Deleted

## 2019-05-08 DIAGNOSIS — E538 Deficiency of other specified B group vitamins: Secondary | ICD-10-CM

## 2019-05-08 MED ORDER — CYANOCOBALAMIN 1000 MCG/ML IJ SOLN
1000.0000 ug | Freq: Once | INTRAMUSCULAR | Status: AC
  Administered 2019-05-08: 1000 ug via INTRAMUSCULAR

## 2019-05-08 NOTE — Progress Notes (Signed)
Pls cosign for B12 inj../lmb  

## 2019-05-20 DIAGNOSIS — Z30014 Encounter for initial prescription of intrauterine contraceptive device: Secondary | ICD-10-CM | POA: Diagnosis not present

## 2019-06-04 DIAGNOSIS — Z30433 Encounter for removal and reinsertion of intrauterine contraceptive device: Secondary | ICD-10-CM | POA: Diagnosis not present

## 2019-06-10 DIAGNOSIS — Z130 Encounter for screening for diseases of the blood and blood-forming organs and certain disorders involving the immune mechanism: Secondary | ICD-10-CM | POA: Diagnosis not present

## 2019-06-10 DIAGNOSIS — Z3200 Encounter for pregnancy test, result unknown: Secondary | ICD-10-CM | POA: Diagnosis not present

## 2019-06-10 DIAGNOSIS — Z13220 Encounter for screening for lipoid disorders: Secondary | ICD-10-CM | POA: Diagnosis not present

## 2019-06-10 DIAGNOSIS — Z1159 Encounter for screening for other viral diseases: Secondary | ICD-10-CM | POA: Diagnosis not present

## 2019-06-10 DIAGNOSIS — D510 Vitamin B12 deficiency anemia due to intrinsic factor deficiency: Secondary | ICD-10-CM | POA: Diagnosis not present

## 2019-06-10 DIAGNOSIS — S99922A Unspecified injury of left foot, initial encounter: Secondary | ICD-10-CM | POA: Diagnosis not present

## 2019-06-10 DIAGNOSIS — Z5181 Encounter for therapeutic drug level monitoring: Secondary | ICD-10-CM | POA: Diagnosis not present

## 2019-06-11 ENCOUNTER — Other Ambulatory Visit: Payer: Self-pay | Admitting: Neurology

## 2019-06-11 DIAGNOSIS — G379 Demyelinating disease of central nervous system, unspecified: Secondary | ICD-10-CM

## 2019-06-11 DIAGNOSIS — E538 Deficiency of other specified B group vitamins: Secondary | ICD-10-CM

## 2019-06-29 DIAGNOSIS — Z30431 Encounter for routine checking of intrauterine contraceptive device: Secondary | ICD-10-CM | POA: Diagnosis not present

## 2019-07-10 DIAGNOSIS — E538 Deficiency of other specified B group vitamins: Secondary | ICD-10-CM | POA: Diagnosis not present

## 2019-07-26 DIAGNOSIS — J029 Acute pharyngitis, unspecified: Secondary | ICD-10-CM | POA: Diagnosis not present

## 2019-07-26 DIAGNOSIS — J00 Acute nasopharyngitis [common cold]: Secondary | ICD-10-CM | POA: Diagnosis not present

## 2019-07-26 DIAGNOSIS — R0981 Nasal congestion: Secondary | ICD-10-CM | POA: Diagnosis not present

## 2019-07-26 DIAGNOSIS — Z03818 Encounter for observation for suspected exposure to other biological agents ruled out: Secondary | ICD-10-CM | POA: Diagnosis not present

## 2019-08-11 DIAGNOSIS — E538 Deficiency of other specified B group vitamins: Secondary | ICD-10-CM | POA: Diagnosis not present

## 2019-08-13 DIAGNOSIS — M25572 Pain in left ankle and joints of left foot: Secondary | ICD-10-CM | POA: Diagnosis not present

## 2019-08-13 DIAGNOSIS — M79672 Pain in left foot: Secondary | ICD-10-CM | POA: Diagnosis not present

## 2019-09-04 DIAGNOSIS — R5383 Other fatigue: Secondary | ICD-10-CM | POA: Diagnosis not present

## 2019-09-04 DIAGNOSIS — F418 Other specified anxiety disorders: Secondary | ICD-10-CM | POA: Diagnosis not present

## 2019-09-04 DIAGNOSIS — E538 Deficiency of other specified B group vitamins: Secondary | ICD-10-CM | POA: Diagnosis not present

## 2019-09-04 DIAGNOSIS — F449 Dissociative and conversion disorder, unspecified: Secondary | ICD-10-CM | POA: Diagnosis not present

## 2019-09-19 DIAGNOSIS — T63481A Toxic effect of venom of other arthropod, accidental (unintentional), initial encounter: Secondary | ICD-10-CM | POA: Diagnosis not present

## 2019-09-22 DIAGNOSIS — R9082 White matter disease, unspecified: Secondary | ICD-10-CM | POA: Diagnosis not present

## 2019-10-02 DIAGNOSIS — A6920 Lyme disease, unspecified: Secondary | ICD-10-CM | POA: Diagnosis not present

## 2019-10-02 DIAGNOSIS — G939 Disorder of brain, unspecified: Secondary | ICD-10-CM | POA: Diagnosis not present

## 2019-10-02 DIAGNOSIS — D510 Vitamin B12 deficiency anemia due to intrinsic factor deficiency: Secondary | ICD-10-CM | POA: Diagnosis not present

## 2019-10-03 DIAGNOSIS — Z20822 Contact with and (suspected) exposure to covid-19: Secondary | ICD-10-CM | POA: Diagnosis not present

## 2019-10-09 DIAGNOSIS — R9082 White matter disease, unspecified: Secondary | ICD-10-CM | POA: Diagnosis not present

## 2019-10-09 DIAGNOSIS — G379 Demyelinating disease of central nervous system, unspecified: Secondary | ICD-10-CM | POA: Diagnosis not present

## 2019-10-09 DIAGNOSIS — G939 Disorder of brain, unspecified: Secondary | ICD-10-CM | POA: Diagnosis not present

## 2019-10-13 DIAGNOSIS — M25572 Pain in left ankle and joints of left foot: Secondary | ICD-10-CM | POA: Diagnosis not present

## 2019-10-15 DIAGNOSIS — F4323 Adjustment disorder with mixed anxiety and depressed mood: Secondary | ICD-10-CM | POA: Diagnosis not present

## 2019-10-21 DIAGNOSIS — M25572 Pain in left ankle and joints of left foot: Secondary | ICD-10-CM | POA: Diagnosis not present

## 2019-10-23 DIAGNOSIS — F4323 Adjustment disorder with mixed anxiety and depressed mood: Secondary | ICD-10-CM | POA: Diagnosis not present

## 2019-10-29 DIAGNOSIS — F4323 Adjustment disorder with mixed anxiety and depressed mood: Secondary | ICD-10-CM | POA: Diagnosis not present

## 2019-10-30 DIAGNOSIS — D519 Vitamin B12 deficiency anemia, unspecified: Secondary | ICD-10-CM | POA: Diagnosis not present

## 2019-10-30 DIAGNOSIS — Z23 Encounter for immunization: Secondary | ICD-10-CM | POA: Diagnosis not present

## 2019-11-02 DIAGNOSIS — F4323 Adjustment disorder with mixed anxiety and depressed mood: Secondary | ICD-10-CM | POA: Diagnosis not present

## 2019-11-05 DIAGNOSIS — M25572 Pain in left ankle and joints of left foot: Secondary | ICD-10-CM | POA: Diagnosis not present

## 2019-11-11 DIAGNOSIS — F4323 Adjustment disorder with mixed anxiety and depressed mood: Secondary | ICD-10-CM | POA: Diagnosis not present

## 2019-11-18 DIAGNOSIS — F4323 Adjustment disorder with mixed anxiety and depressed mood: Secondary | ICD-10-CM | POA: Diagnosis not present

## 2019-12-04 DIAGNOSIS — F4323 Adjustment disorder with mixed anxiety and depressed mood: Secondary | ICD-10-CM | POA: Diagnosis not present

## 2019-12-04 DIAGNOSIS — E538 Deficiency of other specified B group vitamins: Secondary | ICD-10-CM | POA: Diagnosis not present

## 2019-12-04 DIAGNOSIS — F419 Anxiety disorder, unspecified: Secondary | ICD-10-CM | POA: Diagnosis not present

## 2019-12-16 DIAGNOSIS — F4323 Adjustment disorder with mixed anxiety and depressed mood: Secondary | ICD-10-CM | POA: Diagnosis not present

## 2020-01-30 IMAGING — XA DG FLUORO GUIDE SPINAL/SI JT INJ*L*
1 series · 1 of 1 positions shown · non-contrast
Comparison: none

CLINICAL DATA: Multiple sclerosis. Numbness and tingling in the
upper extremities. Abnormal MRI of the brain.

[Series 1: ortho standard · 1 of 1 slices shown]
[im 1/1]
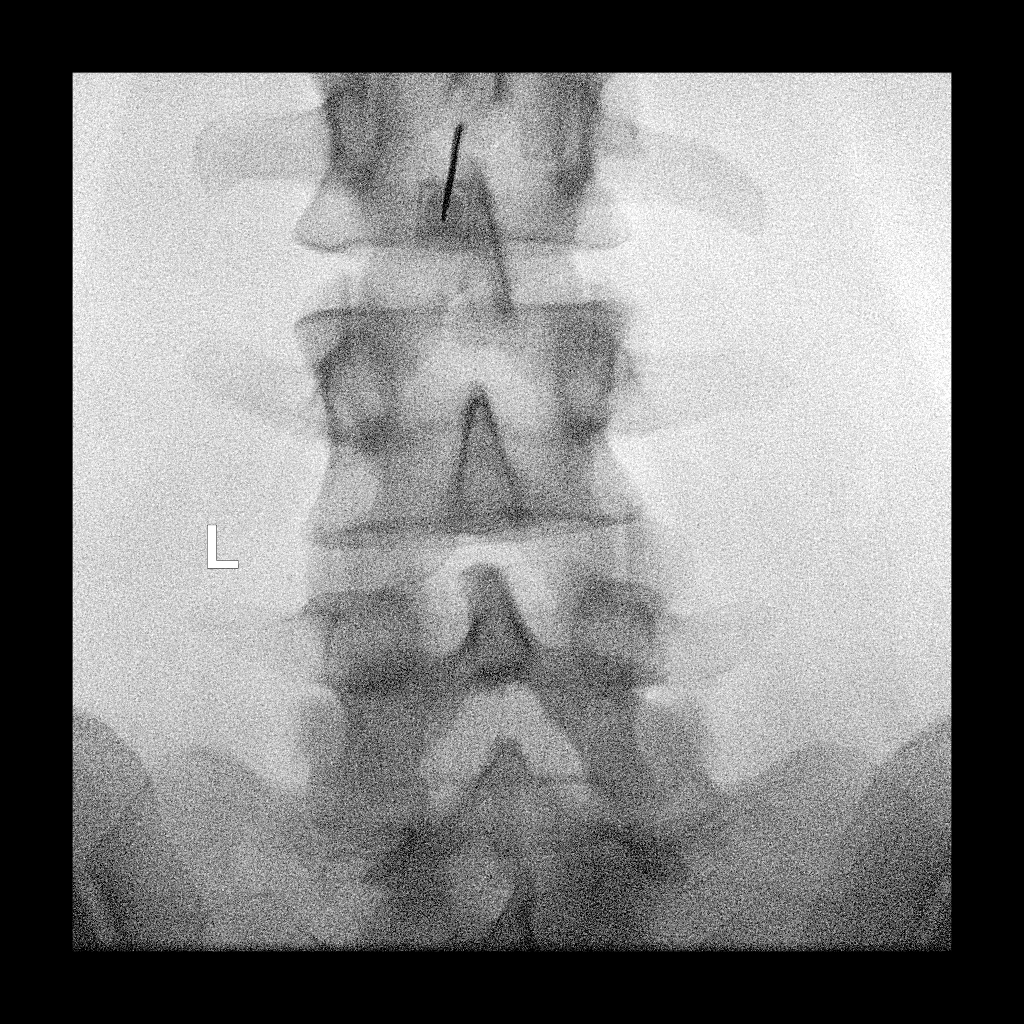

[1 of 1 positions shown; findings below may reference images not displayed]

EXAM:
DIAGNOSTIC LUMBAR PUNCTURE UNDER FLUOROSCOPIC GUIDANCE

FLUOROSCOPY TIME:  Radiation Exposure Index (as provided by the
fluoroscopic device): 5.46 uGy*m2

PROCEDURE:
Informed consent was obtained from the patient prior to the
procedure, including potential complications of headache, allergy,
and pain. With the patient prone, the lower back was prepped with
Betadine. 1% Lidocaine was used for local anesthesia. Lumbar
puncture was performed at the left paramedian L2-3 level using a 20
gauge needle with return of clear CSF with an opening pressure of 16
cm water. 9.5ml of CSF were obtained for laboratory studies. The
patient tolerated the procedure well and there were no apparent
complications.
IMPRESSION: Technically successful fluoroscopic guided lumbar puncture via a
left paramedian approach at L2-3.

Normal opening pressure.

9.5 mL clear CSF sent for laboratory testing as requested.
